# Patient Record
Sex: Male | Born: 1935 | Race: White | Hispanic: No | Smoking: Never smoker
Health system: Southern US, Community
[De-identification: ages and names within clinical notes are randomized; demographics above are authoritative.]

## PROBLEM LIST (undated history)

## (undated) DIAGNOSIS — D62 Acute posthemorrhagic anemia: Secondary | ICD-10-CM

## (undated) DIAGNOSIS — M6282 Rhabdomyolysis: Secondary | ICD-10-CM

## (undated) DIAGNOSIS — M1990 Unspecified osteoarthritis, unspecified site: Secondary | ICD-10-CM

## (undated) DIAGNOSIS — N390 Urinary tract infection, site not specified: Secondary | ICD-10-CM

## (undated) DIAGNOSIS — K922 Gastrointestinal hemorrhage, unspecified: Secondary | ICD-10-CM

## (undated) DIAGNOSIS — I214 Non-ST elevation (NSTEMI) myocardial infarction: Secondary | ICD-10-CM

## (undated) DIAGNOSIS — N400 Enlarged prostate without lower urinary tract symptoms: Secondary | ICD-10-CM

## (undated) DIAGNOSIS — F419 Anxiety disorder, unspecified: Secondary | ICD-10-CM

## (undated) DIAGNOSIS — I10 Essential (primary) hypertension: Secondary | ICD-10-CM

## (undated) DIAGNOSIS — E871 Hypo-osmolality and hyponatremia: Secondary | ICD-10-CM

## (undated) HISTORY — PX: OTHER SURGICAL HISTORY: SHX169

---

## 2011-08-07 DIAGNOSIS — H6980 Other specified disorders of Eustachian tube, unspecified ear: Secondary | ICD-10-CM | POA: Diagnosis not present

## 2012-03-17 DIAGNOSIS — I10 Essential (primary) hypertension: Secondary | ICD-10-CM | POA: Diagnosis not present

## 2012-03-17 DIAGNOSIS — G119 Hereditary ataxia, unspecified: Secondary | ICD-10-CM | POA: Diagnosis not present

## 2012-03-17 DIAGNOSIS — Z79899 Other long term (current) drug therapy: Secondary | ICD-10-CM | POA: Diagnosis not present

## 2012-03-17 DIAGNOSIS — R7309 Other abnormal glucose: Secondary | ICD-10-CM | POA: Diagnosis not present

## 2012-04-09 DIAGNOSIS — I10 Essential (primary) hypertension: Secondary | ICD-10-CM | POA: Diagnosis not present

## 2012-04-09 DIAGNOSIS — E875 Hyperkalemia: Secondary | ICD-10-CM | POA: Diagnosis not present

## 2012-10-18 DIAGNOSIS — I10 Essential (primary) hypertension: Secondary | ICD-10-CM | POA: Diagnosis not present

## 2012-10-18 DIAGNOSIS — E875 Hyperkalemia: Secondary | ICD-10-CM | POA: Diagnosis not present

## 2012-10-18 DIAGNOSIS — G119 Hereditary ataxia, unspecified: Secondary | ICD-10-CM | POA: Diagnosis not present

## 2012-12-10 DIAGNOSIS — I10 Essential (primary) hypertension: Secondary | ICD-10-CM | POA: Diagnosis not present

## 2012-12-10 DIAGNOSIS — R030 Elevated blood-pressure reading, without diagnosis of hypertension: Secondary | ICD-10-CM | POA: Diagnosis not present

## 2012-12-10 DIAGNOSIS — T7020XA Unspecified effects of high altitude, initial encounter: Secondary | ICD-10-CM | POA: Diagnosis not present

## 2012-12-10 DIAGNOSIS — R42 Dizziness and giddiness: Secondary | ICD-10-CM | POA: Diagnosis not present

## 2012-12-10 DIAGNOSIS — W9411XA Exposure to residence or prolonged visit at high altitude, initial encounter: Secondary | ICD-10-CM | POA: Diagnosis not present

## 2012-12-11 DIAGNOSIS — T7020XA Unspecified effects of high altitude, initial encounter: Secondary | ICD-10-CM | POA: Diagnosis not present

## 2012-12-11 DIAGNOSIS — I10 Essential (primary) hypertension: Secondary | ICD-10-CM | POA: Diagnosis not present

## 2012-12-11 DIAGNOSIS — W9411XA Exposure to residence or prolonged visit at high altitude, initial encounter: Secondary | ICD-10-CM | POA: Diagnosis not present

## 2012-12-11 DIAGNOSIS — R42 Dizziness and giddiness: Secondary | ICD-10-CM | POA: Diagnosis not present

## 2012-12-14 DIAGNOSIS — I10 Essential (primary) hypertension: Secondary | ICD-10-CM | POA: Diagnosis not present

## 2012-12-14 DIAGNOSIS — G119 Hereditary ataxia, unspecified: Secondary | ICD-10-CM | POA: Diagnosis not present

## 2012-12-19 DIAGNOSIS — R269 Unspecified abnormalities of gait and mobility: Secondary | ICD-10-CM | POA: Diagnosis not present

## 2012-12-19 DIAGNOSIS — Z7982 Long term (current) use of aspirin: Secondary | ICD-10-CM | POA: Diagnosis not present

## 2012-12-19 DIAGNOSIS — Z79899 Other long term (current) drug therapy: Secondary | ICD-10-CM | POA: Diagnosis not present

## 2012-12-19 DIAGNOSIS — E871 Hypo-osmolality and hyponatremia: Secondary | ICD-10-CM | POA: Diagnosis not present

## 2012-12-19 DIAGNOSIS — R5383 Other fatigue: Secondary | ICD-10-CM | POA: Diagnosis not present

## 2012-12-19 DIAGNOSIS — I10 Essential (primary) hypertension: Secondary | ICD-10-CM | POA: Diagnosis not present

## 2012-12-19 DIAGNOSIS — R5381 Other malaise: Secondary | ICD-10-CM | POA: Diagnosis not present

## 2012-12-28 DIAGNOSIS — E875 Hyperkalemia: Secondary | ICD-10-CM | POA: Diagnosis not present

## 2012-12-31 DIAGNOSIS — R0902 Hypoxemia: Secondary | ICD-10-CM | POA: Diagnosis not present

## 2013-01-07 DIAGNOSIS — Z0000 Encounter for general adult medical examination without abnormal findings: Secondary | ICD-10-CM | POA: Diagnosis not present

## 2013-01-07 DIAGNOSIS — E871 Hypo-osmolality and hyponatremia: Secondary | ICD-10-CM | POA: Diagnosis not present

## 2013-01-07 DIAGNOSIS — I479 Paroxysmal tachycardia, unspecified: Secondary | ICD-10-CM | POA: Diagnosis not present

## 2013-01-10 DIAGNOSIS — I479 Paroxysmal tachycardia, unspecified: Secondary | ICD-10-CM | POA: Diagnosis not present

## 2013-02-07 DIAGNOSIS — R7989 Other specified abnormal findings of blood chemistry: Secondary | ICD-10-CM | POA: Diagnosis not present

## 2013-03-22 DIAGNOSIS — R7989 Other specified abnormal findings of blood chemistry: Secondary | ICD-10-CM | POA: Diagnosis not present

## 2013-04-13 DIAGNOSIS — I10 Essential (primary) hypertension: Secondary | ICD-10-CM | POA: Diagnosis not present

## 2013-07-12 DIAGNOSIS — J9819 Other pulmonary collapse: Secondary | ICD-10-CM | POA: Diagnosis not present

## 2013-07-12 DIAGNOSIS — R42 Dizziness and giddiness: Secondary | ICD-10-CM | POA: Diagnosis not present

## 2013-07-12 DIAGNOSIS — R5381 Other malaise: Secondary | ICD-10-CM | POA: Diagnosis not present

## 2013-07-14 DIAGNOSIS — Z006 Encounter for examination for normal comparison and control in clinical research program: Secondary | ICD-10-CM | POA: Diagnosis not present

## 2013-07-14 DIAGNOSIS — E871 Hypo-osmolality and hyponatremia: Secondary | ICD-10-CM | POA: Diagnosis not present

## 2013-07-14 DIAGNOSIS — I10 Essential (primary) hypertension: Secondary | ICD-10-CM | POA: Diagnosis not present

## 2013-07-14 DIAGNOSIS — R7989 Other specified abnormal findings of blood chemistry: Secondary | ICD-10-CM | POA: Diagnosis not present

## 2013-12-16 DIAGNOSIS — J309 Allergic rhinitis, unspecified: Secondary | ICD-10-CM | POA: Diagnosis not present

## 2013-12-16 DIAGNOSIS — E860 Dehydration: Secondary | ICD-10-CM | POA: Diagnosis not present

## 2013-12-16 DIAGNOSIS — Z23 Encounter for immunization: Secondary | ICD-10-CM | POA: Diagnosis not present

## 2013-12-16 DIAGNOSIS — Z79899 Other long term (current) drug therapy: Secondary | ICD-10-CM | POA: Diagnosis not present

## 2013-12-16 DIAGNOSIS — R42 Dizziness and giddiness: Secondary | ICD-10-CM | POA: Diagnosis not present

## 2014-02-08 DIAGNOSIS — R5383 Other fatigue: Secondary | ICD-10-CM | POA: Diagnosis not present

## 2014-02-08 DIAGNOSIS — Z79899 Other long term (current) drug therapy: Secondary | ICD-10-CM | POA: Diagnosis not present

## 2014-02-08 DIAGNOSIS — R5381 Other malaise: Secondary | ICD-10-CM | POA: Diagnosis not present

## 2014-02-08 DIAGNOSIS — I10 Essential (primary) hypertension: Secondary | ICD-10-CM | POA: Diagnosis not present

## 2014-02-08 DIAGNOSIS — R51 Headache: Secondary | ICD-10-CM | POA: Diagnosis not present

## 2014-02-08 DIAGNOSIS — E871 Hypo-osmolality and hyponatremia: Secondary | ICD-10-CM | POA: Diagnosis not present

## 2014-02-08 DIAGNOSIS — G319 Degenerative disease of nervous system, unspecified: Secondary | ICD-10-CM | POA: Diagnosis not present

## 2014-02-09 DIAGNOSIS — R5383 Other fatigue: Secondary | ICD-10-CM | POA: Diagnosis not present

## 2014-02-09 DIAGNOSIS — R5381 Other malaise: Secondary | ICD-10-CM | POA: Diagnosis not present

## 2014-02-13 DIAGNOSIS — R5383 Other fatigue: Secondary | ICD-10-CM | POA: Diagnosis not present

## 2014-02-13 DIAGNOSIS — E871 Hypo-osmolality and hyponatremia: Secondary | ICD-10-CM | POA: Diagnosis not present

## 2014-02-13 DIAGNOSIS — R5381 Other malaise: Secondary | ICD-10-CM | POA: Diagnosis not present

## 2014-03-08 DIAGNOSIS — R5381 Other malaise: Secondary | ICD-10-CM | POA: Diagnosis not present

## 2014-03-08 DIAGNOSIS — R5383 Other fatigue: Secondary | ICD-10-CM | POA: Diagnosis not present

## 2014-03-08 DIAGNOSIS — Z6827 Body mass index (BMI) 27.0-27.9, adult: Secondary | ICD-10-CM | POA: Diagnosis not present

## 2014-03-08 DIAGNOSIS — R351 Nocturia: Secondary | ICD-10-CM | POA: Diagnosis not present

## 2014-03-08 DIAGNOSIS — I10 Essential (primary) hypertension: Secondary | ICD-10-CM | POA: Diagnosis not present

## 2014-03-08 DIAGNOSIS — N390 Urinary tract infection, site not specified: Secondary | ICD-10-CM | POA: Diagnosis not present

## 2014-03-23 DIAGNOSIS — Z139 Encounter for screening, unspecified: Secondary | ICD-10-CM | POA: Diagnosis not present

## 2014-03-23 DIAGNOSIS — Z0000 Encounter for general adult medical examination without abnormal findings: Secondary | ICD-10-CM | POA: Diagnosis not present

## 2014-03-23 DIAGNOSIS — R5381 Other malaise: Secondary | ICD-10-CM | POA: Diagnosis not present

## 2014-03-23 DIAGNOSIS — R358 Other polyuria: Secondary | ICD-10-CM | POA: Diagnosis not present

## 2014-03-23 DIAGNOSIS — Z1331 Encounter for screening for depression: Secondary | ICD-10-CM | POA: Diagnosis not present

## 2014-03-23 DIAGNOSIS — R350 Frequency of micturition: Secondary | ICD-10-CM | POA: Diagnosis not present

## 2014-03-23 DIAGNOSIS — I10 Essential (primary) hypertension: Secondary | ICD-10-CM | POA: Diagnosis not present

## 2014-03-23 DIAGNOSIS — R3589 Other polyuria: Secondary | ICD-10-CM | POA: Diagnosis not present

## 2014-03-23 DIAGNOSIS — Z1389 Encounter for screening for other disorder: Secondary | ICD-10-CM | POA: Diagnosis not present

## 2014-04-03 DIAGNOSIS — E875 Hyperkalemia: Secondary | ICD-10-CM | POA: Diagnosis not present

## 2014-04-03 DIAGNOSIS — D72829 Elevated white blood cell count, unspecified: Secondary | ICD-10-CM | POA: Diagnosis not present

## 2014-04-03 DIAGNOSIS — Z6827 Body mass index (BMI) 27.0-27.9, adult: Secondary | ICD-10-CM | POA: Diagnosis not present

## 2014-04-13 DIAGNOSIS — E875 Hyperkalemia: Secondary | ICD-10-CM | POA: Diagnosis not present

## 2014-04-13 DIAGNOSIS — R000 Tachycardia, unspecified: Secondary | ICD-10-CM | POA: Diagnosis not present

## 2014-04-13 DIAGNOSIS — D72829 Elevated white blood cell count, unspecified: Secondary | ICD-10-CM | POA: Diagnosis not present

## 2014-04-13 DIAGNOSIS — G4700 Insomnia, unspecified: Secondary | ICD-10-CM | POA: Diagnosis not present

## 2014-04-28 DIAGNOSIS — R5382 Chronic fatigue, unspecified: Secondary | ICD-10-CM | POA: Diagnosis not present

## 2014-04-28 DIAGNOSIS — G9332 Myalgic encephalomyelitis/chronic fatigue syndrome: Secondary | ICD-10-CM | POA: Diagnosis not present

## 2014-04-28 DIAGNOSIS — R42 Dizziness and giddiness: Secondary | ICD-10-CM | POA: Diagnosis not present

## 2014-04-28 DIAGNOSIS — R002 Palpitations: Secondary | ICD-10-CM | POA: Diagnosis not present

## 2014-04-28 DIAGNOSIS — I10 Essential (primary) hypertension: Secondary | ICD-10-CM | POA: Diagnosis not present

## 2014-05-08 DIAGNOSIS — Z6827 Body mass index (BMI) 27.0-27.9, adult: Secondary | ICD-10-CM | POA: Diagnosis not present

## 2014-05-08 DIAGNOSIS — D72829 Elevated white blood cell count, unspecified: Secondary | ICD-10-CM | POA: Diagnosis not present

## 2014-05-08 DIAGNOSIS — E875 Hyperkalemia: Secondary | ICD-10-CM | POA: Diagnosis not present

## 2014-05-08 DIAGNOSIS — G4700 Insomnia, unspecified: Secondary | ICD-10-CM | POA: Diagnosis not present

## 2014-05-15 DIAGNOSIS — R002 Palpitations: Secondary | ICD-10-CM | POA: Diagnosis not present

## 2014-05-17 DIAGNOSIS — R002 Palpitations: Secondary | ICD-10-CM | POA: Diagnosis not present

## 2014-05-25 DIAGNOSIS — R2681 Unsteadiness on feet: Secondary | ICD-10-CM | POA: Diagnosis not present

## 2014-05-25 DIAGNOSIS — I10 Essential (primary) hypertension: Secondary | ICD-10-CM | POA: Diagnosis not present

## 2014-05-25 DIAGNOSIS — R002 Palpitations: Secondary | ICD-10-CM | POA: Diagnosis not present

## 2014-06-21 DIAGNOSIS — S329XXD Fracture of unspecified parts of lumbosacral spine and pelvis, subsequent encounter for fracture with routine healing: Secondary | ICD-10-CM | POA: Diagnosis not present

## 2014-06-21 DIAGNOSIS — M25552 Pain in left hip: Secondary | ICD-10-CM | POA: Diagnosis not present

## 2014-06-21 DIAGNOSIS — M1612 Unilateral primary osteoarthritis, left hip: Secondary | ICD-10-CM | POA: Diagnosis not present

## 2014-06-21 DIAGNOSIS — Z6827 Body mass index (BMI) 27.0-27.9, adult: Secondary | ICD-10-CM | POA: Diagnosis not present

## 2014-06-26 DIAGNOSIS — R2689 Other abnormalities of gait and mobility: Secondary | ICD-10-CM | POA: Diagnosis not present

## 2014-06-26 DIAGNOSIS — R262 Difficulty in walking, not elsewhere classified: Secondary | ICD-10-CM | POA: Diagnosis not present

## 2014-06-26 DIAGNOSIS — R2681 Unsteadiness on feet: Secondary | ICD-10-CM | POA: Diagnosis not present

## 2014-06-26 DIAGNOSIS — M6281 Muscle weakness (generalized): Secondary | ICD-10-CM | POA: Diagnosis not present

## 2014-07-04 DIAGNOSIS — R262 Difficulty in walking, not elsewhere classified: Secondary | ICD-10-CM | POA: Diagnosis not present

## 2014-07-04 DIAGNOSIS — R2681 Unsteadiness on feet: Secondary | ICD-10-CM | POA: Diagnosis not present

## 2014-07-04 DIAGNOSIS — R2689 Other abnormalities of gait and mobility: Secondary | ICD-10-CM | POA: Diagnosis not present

## 2014-07-04 DIAGNOSIS — M6281 Muscle weakness (generalized): Secondary | ICD-10-CM | POA: Diagnosis not present

## 2014-07-06 DIAGNOSIS — R2689 Other abnormalities of gait and mobility: Secondary | ICD-10-CM | POA: Diagnosis not present

## 2014-07-06 DIAGNOSIS — R2681 Unsteadiness on feet: Secondary | ICD-10-CM | POA: Diagnosis not present

## 2014-07-06 DIAGNOSIS — R262 Difficulty in walking, not elsewhere classified: Secondary | ICD-10-CM | POA: Diagnosis not present

## 2014-07-06 DIAGNOSIS — M6281 Muscle weakness (generalized): Secondary | ICD-10-CM | POA: Diagnosis not present

## 2014-07-13 DIAGNOSIS — R2689 Other abnormalities of gait and mobility: Secondary | ICD-10-CM | POA: Diagnosis not present

## 2014-07-13 DIAGNOSIS — M6281 Muscle weakness (generalized): Secondary | ICD-10-CM | POA: Diagnosis not present

## 2014-07-13 DIAGNOSIS — R2681 Unsteadiness on feet: Secondary | ICD-10-CM | POA: Diagnosis not present

## 2014-07-13 DIAGNOSIS — R262 Difficulty in walking, not elsewhere classified: Secondary | ICD-10-CM | POA: Diagnosis not present

## 2014-07-14 DIAGNOSIS — R262 Difficulty in walking, not elsewhere classified: Secondary | ICD-10-CM | POA: Diagnosis not present

## 2014-07-14 DIAGNOSIS — R2689 Other abnormalities of gait and mobility: Secondary | ICD-10-CM | POA: Diagnosis not present

## 2014-07-14 DIAGNOSIS — R2681 Unsteadiness on feet: Secondary | ICD-10-CM | POA: Diagnosis not present

## 2014-07-14 DIAGNOSIS — M6281 Muscle weakness (generalized): Secondary | ICD-10-CM | POA: Diagnosis not present

## 2014-07-17 DIAGNOSIS — R2681 Unsteadiness on feet: Secondary | ICD-10-CM | POA: Diagnosis not present

## 2014-07-17 DIAGNOSIS — M6281 Muscle weakness (generalized): Secondary | ICD-10-CM | POA: Diagnosis not present

## 2014-07-17 DIAGNOSIS — R262 Difficulty in walking, not elsewhere classified: Secondary | ICD-10-CM | POA: Diagnosis not present

## 2014-07-17 DIAGNOSIS — R2689 Other abnormalities of gait and mobility: Secondary | ICD-10-CM | POA: Diagnosis not present

## 2014-07-20 DIAGNOSIS — M6281 Muscle weakness (generalized): Secondary | ICD-10-CM | POA: Diagnosis not present

## 2014-07-20 DIAGNOSIS — R2689 Other abnormalities of gait and mobility: Secondary | ICD-10-CM | POA: Diagnosis not present

## 2014-07-20 DIAGNOSIS — R2681 Unsteadiness on feet: Secondary | ICD-10-CM | POA: Diagnosis not present

## 2014-07-20 DIAGNOSIS — R262 Difficulty in walking, not elsewhere classified: Secondary | ICD-10-CM | POA: Diagnosis not present

## 2014-07-24 DIAGNOSIS — M6281 Muscle weakness (generalized): Secondary | ICD-10-CM | POA: Diagnosis not present

## 2014-07-24 DIAGNOSIS — R262 Difficulty in walking, not elsewhere classified: Secondary | ICD-10-CM | POA: Diagnosis not present

## 2014-07-24 DIAGNOSIS — R2681 Unsteadiness on feet: Secondary | ICD-10-CM | POA: Diagnosis not present

## 2014-07-24 DIAGNOSIS — R2689 Other abnormalities of gait and mobility: Secondary | ICD-10-CM | POA: Diagnosis not present

## 2014-07-31 DIAGNOSIS — M6281 Muscle weakness (generalized): Secondary | ICD-10-CM | POA: Diagnosis not present

## 2014-07-31 DIAGNOSIS — R2681 Unsteadiness on feet: Secondary | ICD-10-CM | POA: Diagnosis not present

## 2014-07-31 DIAGNOSIS — R262 Difficulty in walking, not elsewhere classified: Secondary | ICD-10-CM | POA: Diagnosis not present

## 2014-07-31 DIAGNOSIS — R2689 Other abnormalities of gait and mobility: Secondary | ICD-10-CM | POA: Diagnosis not present

## 2014-08-02 DIAGNOSIS — R262 Difficulty in walking, not elsewhere classified: Secondary | ICD-10-CM | POA: Diagnosis not present

## 2014-08-02 DIAGNOSIS — R2681 Unsteadiness on feet: Secondary | ICD-10-CM | POA: Diagnosis not present

## 2014-08-02 DIAGNOSIS — R2689 Other abnormalities of gait and mobility: Secondary | ICD-10-CM | POA: Diagnosis not present

## 2014-08-02 DIAGNOSIS — M6281 Muscle weakness (generalized): Secondary | ICD-10-CM | POA: Diagnosis not present

## 2014-08-07 DIAGNOSIS — R2689 Other abnormalities of gait and mobility: Secondary | ICD-10-CM | POA: Diagnosis not present

## 2014-08-07 DIAGNOSIS — R2681 Unsteadiness on feet: Secondary | ICD-10-CM | POA: Diagnosis not present

## 2014-08-07 DIAGNOSIS — R262 Difficulty in walking, not elsewhere classified: Secondary | ICD-10-CM | POA: Diagnosis not present

## 2014-08-07 DIAGNOSIS — M6281 Muscle weakness (generalized): Secondary | ICD-10-CM | POA: Diagnosis not present

## 2014-08-10 DIAGNOSIS — R2689 Other abnormalities of gait and mobility: Secondary | ICD-10-CM | POA: Diagnosis not present

## 2014-08-10 DIAGNOSIS — R262 Difficulty in walking, not elsewhere classified: Secondary | ICD-10-CM | POA: Diagnosis not present

## 2014-08-10 DIAGNOSIS — R2681 Unsteadiness on feet: Secondary | ICD-10-CM | POA: Diagnosis not present

## 2014-08-10 DIAGNOSIS — M6281 Muscle weakness (generalized): Secondary | ICD-10-CM | POA: Diagnosis not present

## 2014-08-14 DIAGNOSIS — R2689 Other abnormalities of gait and mobility: Secondary | ICD-10-CM | POA: Diagnosis not present

## 2014-08-14 DIAGNOSIS — R2681 Unsteadiness on feet: Secondary | ICD-10-CM | POA: Diagnosis not present

## 2014-08-14 DIAGNOSIS — R262 Difficulty in walking, not elsewhere classified: Secondary | ICD-10-CM | POA: Diagnosis not present

## 2014-08-14 DIAGNOSIS — M6281 Muscle weakness (generalized): Secondary | ICD-10-CM | POA: Diagnosis not present

## 2014-08-15 DIAGNOSIS — Z13220 Encounter for screening for lipoid disorders: Secondary | ICD-10-CM | POA: Diagnosis not present

## 2014-08-15 DIAGNOSIS — I10 Essential (primary) hypertension: Secondary | ICD-10-CM | POA: Diagnosis not present

## 2014-08-16 DIAGNOSIS — R2681 Unsteadiness on feet: Secondary | ICD-10-CM | POA: Diagnosis not present

## 2014-08-16 DIAGNOSIS — R2689 Other abnormalities of gait and mobility: Secondary | ICD-10-CM | POA: Diagnosis not present

## 2014-08-16 DIAGNOSIS — M6281 Muscle weakness (generalized): Secondary | ICD-10-CM | POA: Diagnosis not present

## 2014-08-16 DIAGNOSIS — R262 Difficulty in walking, not elsewhere classified: Secondary | ICD-10-CM | POA: Diagnosis not present

## 2014-08-21 DIAGNOSIS — R2689 Other abnormalities of gait and mobility: Secondary | ICD-10-CM | POA: Diagnosis not present

## 2014-08-21 DIAGNOSIS — R2681 Unsteadiness on feet: Secondary | ICD-10-CM | POA: Diagnosis not present

## 2014-08-21 DIAGNOSIS — R262 Difficulty in walking, not elsewhere classified: Secondary | ICD-10-CM | POA: Diagnosis not present

## 2014-08-21 DIAGNOSIS — M6281 Muscle weakness (generalized): Secondary | ICD-10-CM | POA: Diagnosis not present

## 2014-08-23 DIAGNOSIS — R262 Difficulty in walking, not elsewhere classified: Secondary | ICD-10-CM | POA: Diagnosis not present

## 2014-08-23 DIAGNOSIS — R2689 Other abnormalities of gait and mobility: Secondary | ICD-10-CM | POA: Diagnosis not present

## 2014-08-23 DIAGNOSIS — R2681 Unsteadiness on feet: Secondary | ICD-10-CM | POA: Diagnosis not present

## 2014-08-23 DIAGNOSIS — M6281 Muscle weakness (generalized): Secondary | ICD-10-CM | POA: Diagnosis not present

## 2014-08-24 DIAGNOSIS — I10 Essential (primary) hypertension: Secondary | ICD-10-CM | POA: Diagnosis not present

## 2014-08-24 DIAGNOSIS — G4700 Insomnia, unspecified: Secondary | ICD-10-CM | POA: Diagnosis not present

## 2014-08-24 DIAGNOSIS — R351 Nocturia: Secondary | ICD-10-CM | POA: Diagnosis not present

## 2014-08-24 DIAGNOSIS — N401 Enlarged prostate with lower urinary tract symptoms: Secondary | ICD-10-CM | POA: Diagnosis not present

## 2014-08-28 DIAGNOSIS — R2681 Unsteadiness on feet: Secondary | ICD-10-CM | POA: Diagnosis not present

## 2014-08-28 DIAGNOSIS — M6281 Muscle weakness (generalized): Secondary | ICD-10-CM | POA: Diagnosis not present

## 2014-08-28 DIAGNOSIS — R2689 Other abnormalities of gait and mobility: Secondary | ICD-10-CM | POA: Diagnosis not present

## 2014-08-28 DIAGNOSIS — R262 Difficulty in walking, not elsewhere classified: Secondary | ICD-10-CM | POA: Diagnosis not present

## 2014-08-30 DIAGNOSIS — R2689 Other abnormalities of gait and mobility: Secondary | ICD-10-CM | POA: Diagnosis not present

## 2014-08-30 DIAGNOSIS — M6281 Muscle weakness (generalized): Secondary | ICD-10-CM | POA: Diagnosis not present

## 2014-08-30 DIAGNOSIS — R262 Difficulty in walking, not elsewhere classified: Secondary | ICD-10-CM | POA: Diagnosis not present

## 2014-08-30 DIAGNOSIS — R2681 Unsteadiness on feet: Secondary | ICD-10-CM | POA: Diagnosis not present

## 2014-09-04 DIAGNOSIS — R2681 Unsteadiness on feet: Secondary | ICD-10-CM | POA: Diagnosis not present

## 2014-09-04 DIAGNOSIS — R2689 Other abnormalities of gait and mobility: Secondary | ICD-10-CM | POA: Diagnosis not present

## 2014-09-04 DIAGNOSIS — R262 Difficulty in walking, not elsewhere classified: Secondary | ICD-10-CM | POA: Diagnosis not present

## 2014-09-04 DIAGNOSIS — M6281 Muscle weakness (generalized): Secondary | ICD-10-CM | POA: Diagnosis not present

## 2014-09-08 DIAGNOSIS — M6281 Muscle weakness (generalized): Secondary | ICD-10-CM | POA: Diagnosis not present

## 2014-09-08 DIAGNOSIS — R262 Difficulty in walking, not elsewhere classified: Secondary | ICD-10-CM | POA: Diagnosis not present

## 2014-09-08 DIAGNOSIS — R2689 Other abnormalities of gait and mobility: Secondary | ICD-10-CM | POA: Diagnosis not present

## 2014-09-08 DIAGNOSIS — R2681 Unsteadiness on feet: Secondary | ICD-10-CM | POA: Diagnosis not present

## 2014-09-11 DIAGNOSIS — R2681 Unsteadiness on feet: Secondary | ICD-10-CM | POA: Diagnosis not present

## 2014-09-11 DIAGNOSIS — R2689 Other abnormalities of gait and mobility: Secondary | ICD-10-CM | POA: Diagnosis not present

## 2014-09-11 DIAGNOSIS — R262 Difficulty in walking, not elsewhere classified: Secondary | ICD-10-CM | POA: Diagnosis not present

## 2014-09-11 DIAGNOSIS — M6281 Muscle weakness (generalized): Secondary | ICD-10-CM | POA: Diagnosis not present

## 2014-09-13 DIAGNOSIS — R262 Difficulty in walking, not elsewhere classified: Secondary | ICD-10-CM | POA: Diagnosis not present

## 2014-09-13 DIAGNOSIS — R2689 Other abnormalities of gait and mobility: Secondary | ICD-10-CM | POA: Diagnosis not present

## 2014-09-13 DIAGNOSIS — M6281 Muscle weakness (generalized): Secondary | ICD-10-CM | POA: Diagnosis not present

## 2014-09-13 DIAGNOSIS — R2681 Unsteadiness on feet: Secondary | ICD-10-CM | POA: Diagnosis not present

## 2014-09-18 DIAGNOSIS — R2689 Other abnormalities of gait and mobility: Secondary | ICD-10-CM | POA: Diagnosis not present

## 2014-09-18 DIAGNOSIS — R2681 Unsteadiness on feet: Secondary | ICD-10-CM | POA: Diagnosis not present

## 2014-09-18 DIAGNOSIS — M6281 Muscle weakness (generalized): Secondary | ICD-10-CM | POA: Diagnosis not present

## 2014-09-18 DIAGNOSIS — R262 Difficulty in walking, not elsewhere classified: Secondary | ICD-10-CM | POA: Diagnosis not present

## 2014-09-20 DIAGNOSIS — R262 Difficulty in walking, not elsewhere classified: Secondary | ICD-10-CM | POA: Diagnosis not present

## 2014-09-20 DIAGNOSIS — M6281 Muscle weakness (generalized): Secondary | ICD-10-CM | POA: Diagnosis not present

## 2014-09-20 DIAGNOSIS — R2681 Unsteadiness on feet: Secondary | ICD-10-CM | POA: Diagnosis not present

## 2014-09-20 DIAGNOSIS — R2689 Other abnormalities of gait and mobility: Secondary | ICD-10-CM | POA: Diagnosis not present

## 2014-09-25 DIAGNOSIS — M6281 Muscle weakness (generalized): Secondary | ICD-10-CM | POA: Diagnosis not present

## 2014-09-25 DIAGNOSIS — R2681 Unsteadiness on feet: Secondary | ICD-10-CM | POA: Diagnosis not present

## 2014-09-25 DIAGNOSIS — R262 Difficulty in walking, not elsewhere classified: Secondary | ICD-10-CM | POA: Diagnosis not present

## 2014-09-25 DIAGNOSIS — R2689 Other abnormalities of gait and mobility: Secondary | ICD-10-CM | POA: Diagnosis not present

## 2014-09-26 DIAGNOSIS — M6281 Muscle weakness (generalized): Secondary | ICD-10-CM | POA: Diagnosis not present

## 2014-09-26 DIAGNOSIS — R262 Difficulty in walking, not elsewhere classified: Secondary | ICD-10-CM | POA: Diagnosis not present

## 2014-09-26 DIAGNOSIS — R2689 Other abnormalities of gait and mobility: Secondary | ICD-10-CM | POA: Diagnosis not present

## 2014-09-26 DIAGNOSIS — R2681 Unsteadiness on feet: Secondary | ICD-10-CM | POA: Diagnosis not present

## 2014-10-02 DIAGNOSIS — R2689 Other abnormalities of gait and mobility: Secondary | ICD-10-CM | POA: Diagnosis not present

## 2014-10-02 DIAGNOSIS — R2681 Unsteadiness on feet: Secondary | ICD-10-CM | POA: Diagnosis not present

## 2014-10-02 DIAGNOSIS — R262 Difficulty in walking, not elsewhere classified: Secondary | ICD-10-CM | POA: Diagnosis not present

## 2014-10-02 DIAGNOSIS — M6281 Muscle weakness (generalized): Secondary | ICD-10-CM | POA: Diagnosis not present

## 2014-10-04 DIAGNOSIS — R2689 Other abnormalities of gait and mobility: Secondary | ICD-10-CM | POA: Diagnosis not present

## 2014-10-04 DIAGNOSIS — M6281 Muscle weakness (generalized): Secondary | ICD-10-CM | POA: Diagnosis not present

## 2014-10-04 DIAGNOSIS — R262 Difficulty in walking, not elsewhere classified: Secondary | ICD-10-CM | POA: Diagnosis not present

## 2014-10-04 DIAGNOSIS — R2681 Unsteadiness on feet: Secondary | ICD-10-CM | POA: Diagnosis not present

## 2014-10-13 DIAGNOSIS — R2689 Other abnormalities of gait and mobility: Secondary | ICD-10-CM | POA: Diagnosis not present

## 2014-10-13 DIAGNOSIS — R2681 Unsteadiness on feet: Secondary | ICD-10-CM | POA: Diagnosis not present

## 2014-10-13 DIAGNOSIS — R262 Difficulty in walking, not elsewhere classified: Secondary | ICD-10-CM | POA: Diagnosis not present

## 2014-10-13 DIAGNOSIS — M6281 Muscle weakness (generalized): Secondary | ICD-10-CM | POA: Diagnosis not present

## 2014-10-17 DIAGNOSIS — M6281 Muscle weakness (generalized): Secondary | ICD-10-CM | POA: Diagnosis not present

## 2014-10-17 DIAGNOSIS — R2681 Unsteadiness on feet: Secondary | ICD-10-CM | POA: Diagnosis not present

## 2014-10-17 DIAGNOSIS — R262 Difficulty in walking, not elsewhere classified: Secondary | ICD-10-CM | POA: Diagnosis not present

## 2014-10-17 DIAGNOSIS — R2689 Other abnormalities of gait and mobility: Secondary | ICD-10-CM | POA: Diagnosis not present

## 2014-10-18 DIAGNOSIS — R262 Difficulty in walking, not elsewhere classified: Secondary | ICD-10-CM | POA: Diagnosis not present

## 2014-10-18 DIAGNOSIS — R2681 Unsteadiness on feet: Secondary | ICD-10-CM | POA: Diagnosis not present

## 2014-10-18 DIAGNOSIS — M6281 Muscle weakness (generalized): Secondary | ICD-10-CM | POA: Diagnosis not present

## 2014-10-18 DIAGNOSIS — R2689 Other abnormalities of gait and mobility: Secondary | ICD-10-CM | POA: Diagnosis not present

## 2014-10-22 DIAGNOSIS — R0602 Shortness of breath: Secondary | ICD-10-CM | POA: Diagnosis not present

## 2014-10-22 DIAGNOSIS — R42 Dizziness and giddiness: Secondary | ICD-10-CM | POA: Diagnosis not present

## 2014-10-22 DIAGNOSIS — I10 Essential (primary) hypertension: Secondary | ICD-10-CM | POA: Diagnosis not present

## 2014-10-22 DIAGNOSIS — R300 Dysuria: Secondary | ICD-10-CM | POA: Diagnosis not present

## 2014-10-22 DIAGNOSIS — Z7982 Long term (current) use of aspirin: Secondary | ICD-10-CM | POA: Diagnosis not present

## 2014-10-25 DIAGNOSIS — R262 Difficulty in walking, not elsewhere classified: Secondary | ICD-10-CM | POA: Diagnosis not present

## 2014-10-25 DIAGNOSIS — R2689 Other abnormalities of gait and mobility: Secondary | ICD-10-CM | POA: Diagnosis not present

## 2014-10-25 DIAGNOSIS — R2681 Unsteadiness on feet: Secondary | ICD-10-CM | POA: Diagnosis not present

## 2014-10-25 DIAGNOSIS — M6281 Muscle weakness (generalized): Secondary | ICD-10-CM | POA: Diagnosis not present

## 2014-10-27 DIAGNOSIS — Z6827 Body mass index (BMI) 27.0-27.9, adult: Secondary | ICD-10-CM | POA: Diagnosis not present

## 2014-10-27 DIAGNOSIS — R270 Ataxia, unspecified: Secondary | ICD-10-CM | POA: Diagnosis not present

## 2014-10-30 DIAGNOSIS — R2681 Unsteadiness on feet: Secondary | ICD-10-CM | POA: Diagnosis not present

## 2014-10-30 DIAGNOSIS — R262 Difficulty in walking, not elsewhere classified: Secondary | ICD-10-CM | POA: Diagnosis not present

## 2014-10-30 DIAGNOSIS — M6281 Muscle weakness (generalized): Secondary | ICD-10-CM | POA: Diagnosis not present

## 2014-10-30 DIAGNOSIS — R2689 Other abnormalities of gait and mobility: Secondary | ICD-10-CM | POA: Diagnosis not present

## 2014-11-01 DIAGNOSIS — R2681 Unsteadiness on feet: Secondary | ICD-10-CM | POA: Diagnosis not present

## 2014-11-01 DIAGNOSIS — R262 Difficulty in walking, not elsewhere classified: Secondary | ICD-10-CM | POA: Diagnosis not present

## 2014-11-01 DIAGNOSIS — R2689 Other abnormalities of gait and mobility: Secondary | ICD-10-CM | POA: Diagnosis not present

## 2014-11-01 DIAGNOSIS — M6281 Muscle weakness (generalized): Secondary | ICD-10-CM | POA: Diagnosis not present

## 2014-11-06 DIAGNOSIS — M6281 Muscle weakness (generalized): Secondary | ICD-10-CM | POA: Diagnosis not present

## 2014-11-06 DIAGNOSIS — R2689 Other abnormalities of gait and mobility: Secondary | ICD-10-CM | POA: Diagnosis not present

## 2014-11-06 DIAGNOSIS — R2681 Unsteadiness on feet: Secondary | ICD-10-CM | POA: Diagnosis not present

## 2014-11-06 DIAGNOSIS — R262 Difficulty in walking, not elsewhere classified: Secondary | ICD-10-CM | POA: Diagnosis not present

## 2014-11-08 DIAGNOSIS — R262 Difficulty in walking, not elsewhere classified: Secondary | ICD-10-CM | POA: Diagnosis not present

## 2014-11-08 DIAGNOSIS — M6281 Muscle weakness (generalized): Secondary | ICD-10-CM | POA: Diagnosis not present

## 2014-11-08 DIAGNOSIS — R2689 Other abnormalities of gait and mobility: Secondary | ICD-10-CM | POA: Diagnosis not present

## 2014-11-08 DIAGNOSIS — R2681 Unsteadiness on feet: Secondary | ICD-10-CM | POA: Diagnosis not present

## 2014-11-13 DIAGNOSIS — R262 Difficulty in walking, not elsewhere classified: Secondary | ICD-10-CM | POA: Diagnosis not present

## 2014-11-13 DIAGNOSIS — R2681 Unsteadiness on feet: Secondary | ICD-10-CM | POA: Diagnosis not present

## 2014-11-13 DIAGNOSIS — R2689 Other abnormalities of gait and mobility: Secondary | ICD-10-CM | POA: Diagnosis not present

## 2014-11-13 DIAGNOSIS — M6281 Muscle weakness (generalized): Secondary | ICD-10-CM | POA: Diagnosis not present

## 2014-11-15 DIAGNOSIS — R262 Difficulty in walking, not elsewhere classified: Secondary | ICD-10-CM | POA: Diagnosis not present

## 2014-11-15 DIAGNOSIS — R2689 Other abnormalities of gait and mobility: Secondary | ICD-10-CM | POA: Diagnosis not present

## 2014-11-15 DIAGNOSIS — R2681 Unsteadiness on feet: Secondary | ICD-10-CM | POA: Diagnosis not present

## 2014-11-15 DIAGNOSIS — M6281 Muscle weakness (generalized): Secondary | ICD-10-CM | POA: Diagnosis not present

## 2014-11-20 DIAGNOSIS — R262 Difficulty in walking, not elsewhere classified: Secondary | ICD-10-CM | POA: Diagnosis not present

## 2014-11-20 DIAGNOSIS — R2689 Other abnormalities of gait and mobility: Secondary | ICD-10-CM | POA: Diagnosis not present

## 2014-11-20 DIAGNOSIS — R2681 Unsteadiness on feet: Secondary | ICD-10-CM | POA: Diagnosis not present

## 2014-11-20 DIAGNOSIS — M6281 Muscle weakness (generalized): Secondary | ICD-10-CM | POA: Diagnosis not present

## 2014-11-22 DIAGNOSIS — R262 Difficulty in walking, not elsewhere classified: Secondary | ICD-10-CM | POA: Diagnosis not present

## 2014-11-22 DIAGNOSIS — M6281 Muscle weakness (generalized): Secondary | ICD-10-CM | POA: Diagnosis not present

## 2014-11-22 DIAGNOSIS — R2689 Other abnormalities of gait and mobility: Secondary | ICD-10-CM | POA: Diagnosis not present

## 2014-11-22 DIAGNOSIS — R2681 Unsteadiness on feet: Secondary | ICD-10-CM | POA: Diagnosis not present

## 2014-11-27 DIAGNOSIS — R2681 Unsteadiness on feet: Secondary | ICD-10-CM | POA: Diagnosis not present

## 2014-11-27 DIAGNOSIS — R262 Difficulty in walking, not elsewhere classified: Secondary | ICD-10-CM | POA: Diagnosis not present

## 2014-11-27 DIAGNOSIS — R2689 Other abnormalities of gait and mobility: Secondary | ICD-10-CM | POA: Diagnosis not present

## 2014-11-27 DIAGNOSIS — M6281 Muscle weakness (generalized): Secondary | ICD-10-CM | POA: Diagnosis not present

## 2014-11-29 DIAGNOSIS — R262 Difficulty in walking, not elsewhere classified: Secondary | ICD-10-CM | POA: Diagnosis not present

## 2014-11-29 DIAGNOSIS — R2689 Other abnormalities of gait and mobility: Secondary | ICD-10-CM | POA: Diagnosis not present

## 2014-11-29 DIAGNOSIS — M6281 Muscle weakness (generalized): Secondary | ICD-10-CM | POA: Diagnosis not present

## 2014-11-29 DIAGNOSIS — R2681 Unsteadiness on feet: Secondary | ICD-10-CM | POA: Diagnosis not present

## 2014-12-04 DIAGNOSIS — R2681 Unsteadiness on feet: Secondary | ICD-10-CM | POA: Diagnosis not present

## 2014-12-04 DIAGNOSIS — M6281 Muscle weakness (generalized): Secondary | ICD-10-CM | POA: Diagnosis not present

## 2014-12-04 DIAGNOSIS — R262 Difficulty in walking, not elsewhere classified: Secondary | ICD-10-CM | POA: Diagnosis not present

## 2014-12-04 DIAGNOSIS — R2689 Other abnormalities of gait and mobility: Secondary | ICD-10-CM | POA: Diagnosis not present

## 2014-12-06 DIAGNOSIS — R262 Difficulty in walking, not elsewhere classified: Secondary | ICD-10-CM | POA: Diagnosis not present

## 2014-12-06 DIAGNOSIS — R2689 Other abnormalities of gait and mobility: Secondary | ICD-10-CM | POA: Diagnosis not present

## 2014-12-06 DIAGNOSIS — R2681 Unsteadiness on feet: Secondary | ICD-10-CM | POA: Diagnosis not present

## 2014-12-06 DIAGNOSIS — M6281 Muscle weakness (generalized): Secondary | ICD-10-CM | POA: Diagnosis not present

## 2014-12-11 DIAGNOSIS — R2689 Other abnormalities of gait and mobility: Secondary | ICD-10-CM | POA: Diagnosis not present

## 2014-12-11 DIAGNOSIS — R2681 Unsteadiness on feet: Secondary | ICD-10-CM | POA: Diagnosis not present

## 2014-12-11 DIAGNOSIS — M6281 Muscle weakness (generalized): Secondary | ICD-10-CM | POA: Diagnosis not present

## 2014-12-11 DIAGNOSIS — R262 Difficulty in walking, not elsewhere classified: Secondary | ICD-10-CM | POA: Diagnosis not present

## 2014-12-13 DIAGNOSIS — R2689 Other abnormalities of gait and mobility: Secondary | ICD-10-CM | POA: Diagnosis not present

## 2014-12-13 DIAGNOSIS — R2681 Unsteadiness on feet: Secondary | ICD-10-CM | POA: Diagnosis not present

## 2014-12-13 DIAGNOSIS — M6281 Muscle weakness (generalized): Secondary | ICD-10-CM | POA: Diagnosis not present

## 2014-12-13 DIAGNOSIS — R262 Difficulty in walking, not elsewhere classified: Secondary | ICD-10-CM | POA: Diagnosis not present

## 2014-12-19 DIAGNOSIS — R262 Difficulty in walking, not elsewhere classified: Secondary | ICD-10-CM | POA: Diagnosis not present

## 2014-12-19 DIAGNOSIS — R2681 Unsteadiness on feet: Secondary | ICD-10-CM | POA: Diagnosis not present

## 2014-12-19 DIAGNOSIS — R2689 Other abnormalities of gait and mobility: Secondary | ICD-10-CM | POA: Diagnosis not present

## 2014-12-19 DIAGNOSIS — M6281 Muscle weakness (generalized): Secondary | ICD-10-CM | POA: Diagnosis not present

## 2014-12-21 DIAGNOSIS — R2681 Unsteadiness on feet: Secondary | ICD-10-CM | POA: Diagnosis not present

## 2014-12-21 DIAGNOSIS — M6281 Muscle weakness (generalized): Secondary | ICD-10-CM | POA: Diagnosis not present

## 2014-12-21 DIAGNOSIS — R2689 Other abnormalities of gait and mobility: Secondary | ICD-10-CM | POA: Diagnosis not present

## 2014-12-21 DIAGNOSIS — R262 Difficulty in walking, not elsewhere classified: Secondary | ICD-10-CM | POA: Diagnosis not present

## 2014-12-25 DIAGNOSIS — R2689 Other abnormalities of gait and mobility: Secondary | ICD-10-CM | POA: Diagnosis not present

## 2014-12-25 DIAGNOSIS — R2681 Unsteadiness on feet: Secondary | ICD-10-CM | POA: Diagnosis not present

## 2014-12-25 DIAGNOSIS — M6281 Muscle weakness (generalized): Secondary | ICD-10-CM | POA: Diagnosis not present

## 2014-12-25 DIAGNOSIS — R262 Difficulty in walking, not elsewhere classified: Secondary | ICD-10-CM | POA: Diagnosis not present

## 2014-12-27 DIAGNOSIS — R262 Difficulty in walking, not elsewhere classified: Secondary | ICD-10-CM | POA: Diagnosis not present

## 2014-12-27 DIAGNOSIS — R2681 Unsteadiness on feet: Secondary | ICD-10-CM | POA: Diagnosis not present

## 2014-12-27 DIAGNOSIS — M6281 Muscle weakness (generalized): Secondary | ICD-10-CM | POA: Diagnosis not present

## 2014-12-27 DIAGNOSIS — R2689 Other abnormalities of gait and mobility: Secondary | ICD-10-CM | POA: Diagnosis not present

## 2015-01-07 DIAGNOSIS — N3091 Cystitis, unspecified with hematuria: Secondary | ICD-10-CM | POA: Diagnosis not present

## 2015-01-07 DIAGNOSIS — N419 Inflammatory disease of prostate, unspecified: Secondary | ICD-10-CM | POA: Diagnosis not present

## 2015-01-24 DIAGNOSIS — G4700 Insomnia, unspecified: Secondary | ICD-10-CM | POA: Diagnosis not present

## 2015-01-24 DIAGNOSIS — N401 Enlarged prostate with lower urinary tract symptoms: Secondary | ICD-10-CM | POA: Diagnosis not present

## 2015-01-24 DIAGNOSIS — I10 Essential (primary) hypertension: Secondary | ICD-10-CM | POA: Diagnosis not present

## 2015-01-24 DIAGNOSIS — R351 Nocturia: Secondary | ICD-10-CM | POA: Diagnosis not present

## 2015-02-22 DIAGNOSIS — Z6826 Body mass index (BMI) 26.0-26.9, adult: Secondary | ICD-10-CM | POA: Diagnosis not present

## 2015-02-22 DIAGNOSIS — I10 Essential (primary) hypertension: Secondary | ICD-10-CM | POA: Diagnosis not present

## 2015-04-01 DIAGNOSIS — R350 Frequency of micturition: Secondary | ICD-10-CM | POA: Diagnosis not present

## 2015-04-01 DIAGNOSIS — N3001 Acute cystitis with hematuria: Secondary | ICD-10-CM | POA: Diagnosis not present

## 2015-04-23 DIAGNOSIS — R5383 Other fatigue: Secondary | ICD-10-CM | POA: Diagnosis not present

## 2015-04-23 DIAGNOSIS — D72829 Elevated white blood cell count, unspecified: Secondary | ICD-10-CM | POA: Diagnosis not present

## 2015-04-23 DIAGNOSIS — R351 Nocturia: Secondary | ICD-10-CM | POA: Diagnosis not present

## 2015-04-23 DIAGNOSIS — R232 Flushing: Secondary | ICD-10-CM | POA: Diagnosis not present

## 2015-04-23 DIAGNOSIS — Z130 Encounter for screening for diseases of the blood and blood-forming organs and certain disorders involving the immune mechanism: Secondary | ICD-10-CM | POA: Diagnosis not present

## 2015-04-23 DIAGNOSIS — F419 Anxiety disorder, unspecified: Secondary | ICD-10-CM | POA: Diagnosis not present

## 2015-04-26 DIAGNOSIS — E871 Hypo-osmolality and hyponatremia: Secondary | ICD-10-CM | POA: Diagnosis not present

## 2015-04-26 DIAGNOSIS — E291 Testicular hypofunction: Secondary | ICD-10-CM | POA: Diagnosis not present

## 2015-04-26 DIAGNOSIS — D72829 Elevated white blood cell count, unspecified: Secondary | ICD-10-CM | POA: Diagnosis not present

## 2015-04-26 DIAGNOSIS — R351 Nocturia: Secondary | ICD-10-CM | POA: Diagnosis not present

## 2015-05-22 DIAGNOSIS — E871 Hypo-osmolality and hyponatremia: Secondary | ICD-10-CM | POA: Diagnosis not present

## 2015-05-22 DIAGNOSIS — M25461 Effusion, right knee: Secondary | ICD-10-CM | POA: Diagnosis not present

## 2015-05-22 DIAGNOSIS — Z6826 Body mass index (BMI) 26.0-26.9, adult: Secondary | ICD-10-CM | POA: Diagnosis not present

## 2015-05-22 DIAGNOSIS — D72829 Elevated white blood cell count, unspecified: Secondary | ICD-10-CM | POA: Diagnosis not present

## 2015-05-22 DIAGNOSIS — M25469 Effusion, unspecified knee: Secondary | ICD-10-CM | POA: Diagnosis not present

## 2015-06-05 DIAGNOSIS — Z9181 History of falling: Secondary | ICD-10-CM | POA: Diagnosis not present

## 2015-06-05 DIAGNOSIS — Z0000 Encounter for general adult medical examination without abnormal findings: Secondary | ICD-10-CM | POA: Diagnosis not present

## 2015-06-05 DIAGNOSIS — Z1389 Encounter for screening for other disorder: Secondary | ICD-10-CM | POA: Diagnosis not present

## 2015-06-05 DIAGNOSIS — Z139 Encounter for screening, unspecified: Secondary | ICD-10-CM | POA: Diagnosis not present

## 2015-06-26 DIAGNOSIS — D72829 Elevated white blood cell count, unspecified: Secondary | ICD-10-CM | POA: Diagnosis not present

## 2015-06-26 DIAGNOSIS — E871 Hypo-osmolality and hyponatremia: Secondary | ICD-10-CM | POA: Diagnosis not present

## 2015-08-03 DIAGNOSIS — D72829 Elevated white blood cell count, unspecified: Secondary | ICD-10-CM | POA: Diagnosis not present

## 2015-08-03 DIAGNOSIS — N390 Urinary tract infection, site not specified: Secondary | ICD-10-CM | POA: Diagnosis not present

## 2015-08-03 DIAGNOSIS — R350 Frequency of micturition: Secondary | ICD-10-CM | POA: Diagnosis not present

## 2016-02-04 DIAGNOSIS — I10 Essential (primary) hypertension: Secondary | ICD-10-CM | POA: Diagnosis not present

## 2016-02-04 DIAGNOSIS — R351 Nocturia: Secondary | ICD-10-CM | POA: Diagnosis not present

## 2016-02-04 DIAGNOSIS — F419 Anxiety disorder, unspecified: Secondary | ICD-10-CM | POA: Diagnosis not present

## 2016-02-04 DIAGNOSIS — N401 Enlarged prostate with lower urinary tract symptoms: Secondary | ICD-10-CM | POA: Diagnosis not present

## 2016-02-21 DIAGNOSIS — E871 Hypo-osmolality and hyponatremia: Secondary | ICD-10-CM | POA: Diagnosis not present

## 2016-04-17 DIAGNOSIS — M159 Polyosteoarthritis, unspecified: Secondary | ICD-10-CM | POA: Diagnosis not present

## 2016-04-17 DIAGNOSIS — Z6827 Body mass index (BMI) 27.0-27.9, adult: Secondary | ICD-10-CM | POA: Diagnosis not present

## 2016-05-05 DIAGNOSIS — Z7189 Other specified counseling: Secondary | ICD-10-CM | POA: Diagnosis not present

## 2016-05-05 DIAGNOSIS — H6123 Impacted cerumen, bilateral: Secondary | ICD-10-CM | POA: Diagnosis not present

## 2016-05-05 DIAGNOSIS — Z6827 Body mass index (BMI) 27.0-27.9, adult: Secondary | ICD-10-CM | POA: Diagnosis not present

## 2016-07-21 DIAGNOSIS — Z0000 Encounter for general adult medical examination without abnormal findings: Secondary | ICD-10-CM | POA: Diagnosis not present

## 2016-07-21 DIAGNOSIS — F419 Anxiety disorder, unspecified: Secondary | ICD-10-CM | POA: Diagnosis not present

## 2016-07-21 DIAGNOSIS — N400 Enlarged prostate without lower urinary tract symptoms: Secondary | ICD-10-CM | POA: Diagnosis not present

## 2016-07-21 DIAGNOSIS — I10 Essential (primary) hypertension: Secondary | ICD-10-CM | POA: Diagnosis not present

## 2017-02-02 DIAGNOSIS — H6123 Impacted cerumen, bilateral: Secondary | ICD-10-CM | POA: Diagnosis not present

## 2017-02-02 DIAGNOSIS — H9193 Unspecified hearing loss, bilateral: Secondary | ICD-10-CM | POA: Diagnosis not present

## 2017-02-02 DIAGNOSIS — Z1389 Encounter for screening for other disorder: Secondary | ICD-10-CM | POA: Diagnosis not present

## 2017-02-02 DIAGNOSIS — F419 Anxiety disorder, unspecified: Secondary | ICD-10-CM | POA: Diagnosis not present

## 2017-02-02 DIAGNOSIS — F3160 Bipolar disorder, current episode mixed, unspecified: Secondary | ICD-10-CM | POA: Diagnosis not present

## 2017-02-02 DIAGNOSIS — Z139 Encounter for screening, unspecified: Secondary | ICD-10-CM | POA: Diagnosis not present

## 2017-02-02 DIAGNOSIS — M79609 Pain in unspecified limb: Secondary | ICD-10-CM | POA: Diagnosis not present

## 2017-02-02 DIAGNOSIS — I10 Essential (primary) hypertension: Secondary | ICD-10-CM | POA: Diagnosis not present

## 2017-02-20 ENCOUNTER — Other Ambulatory Visit: Payer: Self-pay

## 2017-07-20 DIAGNOSIS — F419 Anxiety disorder, unspecified: Secondary | ICD-10-CM | POA: Diagnosis not present

## 2017-07-20 DIAGNOSIS — Z6826 Body mass index (BMI) 26.0-26.9, adult: Secondary | ICD-10-CM | POA: Diagnosis not present

## 2017-07-20 DIAGNOSIS — N400 Enlarged prostate without lower urinary tract symptoms: Secondary | ICD-10-CM | POA: Diagnosis not present

## 2017-07-20 DIAGNOSIS — I10 Essential (primary) hypertension: Secondary | ICD-10-CM | POA: Diagnosis not present

## 2017-07-30 DIAGNOSIS — Z1331 Encounter for screening for depression: Secondary | ICD-10-CM | POA: Diagnosis not present

## 2017-07-30 DIAGNOSIS — Z9181 History of falling: Secondary | ICD-10-CM | POA: Diagnosis not present

## 2017-07-30 DIAGNOSIS — Z139 Encounter for screening, unspecified: Secondary | ICD-10-CM | POA: Diagnosis not present

## 2017-07-30 DIAGNOSIS — Z0000 Encounter for general adult medical examination without abnormal findings: Secondary | ICD-10-CM | POA: Diagnosis not present

## 2018-01-11 DIAGNOSIS — I10 Essential (primary) hypertension: Secondary | ICD-10-CM | POA: Diagnosis not present

## 2018-01-18 DIAGNOSIS — I10 Essential (primary) hypertension: Secondary | ICD-10-CM | POA: Diagnosis not present

## 2018-01-18 DIAGNOSIS — F419 Anxiety disorder, unspecified: Secondary | ICD-10-CM | POA: Diagnosis not present

## 2018-01-18 DIAGNOSIS — N400 Enlarged prostate without lower urinary tract symptoms: Secondary | ICD-10-CM | POA: Diagnosis not present

## 2018-01-18 DIAGNOSIS — R7301 Impaired fasting glucose: Secondary | ICD-10-CM | POA: Diagnosis not present

## 2018-05-28 DIAGNOSIS — M25552 Pain in left hip: Secondary | ICD-10-CM | POA: Diagnosis not present

## 2018-05-28 DIAGNOSIS — M25562 Pain in left knee: Secondary | ICD-10-CM | POA: Diagnosis not present

## 2018-05-28 DIAGNOSIS — Z79899 Other long term (current) drug therapy: Secondary | ICD-10-CM | POA: Diagnosis not present

## 2018-05-28 DIAGNOSIS — M79662 Pain in left lower leg: Secondary | ICD-10-CM | POA: Diagnosis not present

## 2018-05-28 DIAGNOSIS — Z7982 Long term (current) use of aspirin: Secondary | ICD-10-CM | POA: Diagnosis not present

## 2018-06-11 DIAGNOSIS — M25562 Pain in left knee: Secondary | ICD-10-CM | POA: Diagnosis not present

## 2018-06-11 DIAGNOSIS — Z6827 Body mass index (BMI) 27.0-27.9, adult: Secondary | ICD-10-CM | POA: Diagnosis not present

## 2018-06-11 DIAGNOSIS — D72829 Elevated white blood cell count, unspecified: Secondary | ICD-10-CM | POA: Diagnosis not present

## 2018-06-11 DIAGNOSIS — Z139 Encounter for screening, unspecified: Secondary | ICD-10-CM | POA: Diagnosis not present

## 2018-07-12 DIAGNOSIS — R7301 Impaired fasting glucose: Secondary | ICD-10-CM | POA: Diagnosis not present

## 2018-07-12 DIAGNOSIS — I10 Essential (primary) hypertension: Secondary | ICD-10-CM | POA: Diagnosis not present

## 2018-07-19 DIAGNOSIS — N400 Enlarged prostate without lower urinary tract symptoms: Secondary | ICD-10-CM | POA: Diagnosis not present

## 2018-07-19 DIAGNOSIS — I10 Essential (primary) hypertension: Secondary | ICD-10-CM | POA: Diagnosis not present

## 2018-07-19 DIAGNOSIS — Z6827 Body mass index (BMI) 27.0-27.9, adult: Secondary | ICD-10-CM | POA: Diagnosis not present

## 2018-07-19 DIAGNOSIS — R7301 Impaired fasting glucose: Secondary | ICD-10-CM | POA: Diagnosis not present

## 2019-01-10 DIAGNOSIS — R7301 Impaired fasting glucose: Secondary | ICD-10-CM | POA: Diagnosis not present

## 2019-01-10 DIAGNOSIS — I10 Essential (primary) hypertension: Secondary | ICD-10-CM | POA: Diagnosis not present

## 2019-01-17 DIAGNOSIS — R0902 Hypoxemia: Secondary | ICD-10-CM | POA: Diagnosis not present

## 2019-01-17 DIAGNOSIS — R7303 Prediabetes: Secondary | ICD-10-CM | POA: Diagnosis not present

## 2019-01-17 DIAGNOSIS — E871 Hypo-osmolality and hyponatremia: Secondary | ICD-10-CM | POA: Diagnosis not present

## 2019-01-17 DIAGNOSIS — Z0000 Encounter for general adult medical examination without abnormal findings: Secondary | ICD-10-CM | POA: Diagnosis not present

## 2019-01-17 DIAGNOSIS — I10 Essential (primary) hypertension: Secondary | ICD-10-CM | POA: Diagnosis not present

## 2019-01-31 DIAGNOSIS — Z6827 Body mass index (BMI) 27.0-27.9, adult: Secondary | ICD-10-CM | POA: Diagnosis not present

## 2019-01-31 DIAGNOSIS — E871 Hypo-osmolality and hyponatremia: Secondary | ICD-10-CM | POA: Diagnosis not present

## 2019-01-31 DIAGNOSIS — R7303 Prediabetes: Secondary | ICD-10-CM | POA: Diagnosis not present

## 2019-01-31 DIAGNOSIS — R0902 Hypoxemia: Secondary | ICD-10-CM | POA: Diagnosis not present

## 2019-02-09 DIAGNOSIS — K5900 Constipation, unspecified: Secondary | ICD-10-CM | POA: Diagnosis not present

## 2019-02-09 DIAGNOSIS — W19XXXA Unspecified fall, initial encounter: Secondary | ICD-10-CM | POA: Diagnosis not present

## 2019-02-09 DIAGNOSIS — R945 Abnormal results of liver function studies: Secondary | ICD-10-CM | POA: Diagnosis not present

## 2019-02-09 DIAGNOSIS — E872 Acidosis: Secondary | ICD-10-CM | POA: Diagnosis not present

## 2019-02-09 DIAGNOSIS — S3991XA Unspecified injury of abdomen, initial encounter: Secondary | ICD-10-CM | POA: Diagnosis not present

## 2019-02-09 DIAGNOSIS — Z79899 Other long term (current) drug therapy: Secondary | ICD-10-CM | POA: Diagnosis not present

## 2019-02-09 DIAGNOSIS — S299XXA Unspecified injury of thorax, initial encounter: Secondary | ICD-10-CM | POA: Diagnosis not present

## 2019-02-09 DIAGNOSIS — R0902 Hypoxemia: Secondary | ICD-10-CM | POA: Diagnosis present

## 2019-02-09 DIAGNOSIS — R739 Hyperglycemia, unspecified: Secondary | ICD-10-CM | POA: Diagnosis not present

## 2019-02-09 DIAGNOSIS — E871 Hypo-osmolality and hyponatremia: Secondary | ICD-10-CM | POA: Diagnosis not present

## 2019-02-09 DIAGNOSIS — R471 Dysarthria and anarthria: Secondary | ICD-10-CM | POA: Diagnosis present

## 2019-02-09 DIAGNOSIS — M1990 Unspecified osteoarthritis, unspecified site: Secondary | ICD-10-CM | POA: Diagnosis present

## 2019-02-09 DIAGNOSIS — Z23 Encounter for immunization: Secondary | ICD-10-CM | POA: Diagnosis not present

## 2019-02-09 DIAGNOSIS — I340 Nonrheumatic mitral (valve) insufficiency: Secondary | ICD-10-CM | POA: Diagnosis not present

## 2019-02-09 DIAGNOSIS — M6282 Rhabdomyolysis: Secondary | ICD-10-CM | POA: Diagnosis not present

## 2019-02-09 DIAGNOSIS — D72829 Elevated white blood cell count, unspecified: Secondary | ICD-10-CM | POA: Diagnosis not present

## 2019-02-09 DIAGNOSIS — R600 Localized edema: Secondary | ICD-10-CM | POA: Diagnosis not present

## 2019-02-09 DIAGNOSIS — R748 Abnormal levels of other serum enzymes: Secondary | ICD-10-CM | POA: Diagnosis not present

## 2019-02-09 DIAGNOSIS — Z743 Need for continuous supervision: Secondary | ICD-10-CM | POA: Diagnosis not present

## 2019-02-09 DIAGNOSIS — S0990XA Unspecified injury of head, initial encounter: Secondary | ICD-10-CM | POA: Diagnosis not present

## 2019-02-09 DIAGNOSIS — R531 Weakness: Secondary | ICD-10-CM | POA: Diagnosis not present

## 2019-02-09 DIAGNOSIS — R4781 Slurred speech: Secondary | ICD-10-CM | POA: Diagnosis not present

## 2019-02-09 DIAGNOSIS — I21A1 Myocardial infarction type 2: Secondary | ICD-10-CM | POA: Diagnosis not present

## 2019-02-09 DIAGNOSIS — I10 Essential (primary) hypertension: Secondary | ICD-10-CM | POA: Diagnosis not present

## 2019-02-09 DIAGNOSIS — E860 Dehydration: Secondary | ICD-10-CM | POA: Diagnosis present

## 2019-02-09 DIAGNOSIS — R6510 Systemic inflammatory response syndrome (SIRS) of non-infectious origin without acute organ dysfunction: Secondary | ICD-10-CM | POA: Diagnosis not present

## 2019-02-09 DIAGNOSIS — I361 Nonrheumatic tricuspid (valve) insufficiency: Secondary | ICD-10-CM | POA: Diagnosis not present

## 2019-02-09 DIAGNOSIS — R29818 Other symptoms and signs involving the nervous system: Secondary | ICD-10-CM | POA: Diagnosis not present

## 2019-02-09 DIAGNOSIS — I214 Non-ST elevation (NSTEMI) myocardial infarction: Secondary | ICD-10-CM | POA: Diagnosis not present

## 2019-02-09 DIAGNOSIS — Z7982 Long term (current) use of aspirin: Secondary | ICD-10-CM | POA: Diagnosis not present

## 2019-02-09 DIAGNOSIS — R000 Tachycardia, unspecified: Secondary | ICD-10-CM | POA: Diagnosis not present

## 2019-02-09 DIAGNOSIS — E8770 Fluid overload, unspecified: Secondary | ICD-10-CM | POA: Diagnosis present

## 2019-02-09 DIAGNOSIS — R740 Nonspecific elevation of levels of transaminase and lactic acid dehydrogenase [LDH]: Secondary | ICD-10-CM | POA: Diagnosis present

## 2019-02-09 DIAGNOSIS — S199XXA Unspecified injury of neck, initial encounter: Secondary | ICD-10-CM | POA: Diagnosis not present

## 2019-02-09 DIAGNOSIS — I959 Hypotension, unspecified: Secondary | ICD-10-CM | POA: Diagnosis not present

## 2019-02-10 DIAGNOSIS — R4781 Slurred speech: Secondary | ICD-10-CM

## 2019-02-10 DIAGNOSIS — I10 Essential (primary) hypertension: Secondary | ICD-10-CM

## 2019-02-10 DIAGNOSIS — M6282 Rhabdomyolysis: Secondary | ICD-10-CM

## 2019-02-10 DIAGNOSIS — I214 Non-ST elevation (NSTEMI) myocardial infarction: Secondary | ICD-10-CM

## 2019-02-10 DIAGNOSIS — R531 Weakness: Secondary | ICD-10-CM

## 2019-02-11 ENCOUNTER — Other Ambulatory Visit: Payer: Self-pay | Admitting: *Deleted

## 2019-02-11 DIAGNOSIS — R002 Palpitations: Secondary | ICD-10-CM

## 2019-02-17 DIAGNOSIS — M6282 Rhabdomyolysis: Secondary | ICD-10-CM | POA: Diagnosis not present

## 2019-02-17 DIAGNOSIS — I959 Hypotension, unspecified: Secondary | ICD-10-CM | POA: Diagnosis not present

## 2019-02-17 DIAGNOSIS — I10 Essential (primary) hypertension: Secondary | ICD-10-CM | POA: Diagnosis present

## 2019-02-17 DIAGNOSIS — K648 Other hemorrhoids: Secondary | ICD-10-CM | POA: Diagnosis present

## 2019-02-17 DIAGNOSIS — R0902 Hypoxemia: Secondary | ICD-10-CM | POA: Diagnosis not present

## 2019-02-17 DIAGNOSIS — R195 Other fecal abnormalities: Secondary | ICD-10-CM | POA: Diagnosis not present

## 2019-02-17 DIAGNOSIS — R4781 Slurred speech: Secondary | ICD-10-CM | POA: Diagnosis not present

## 2019-02-17 DIAGNOSIS — E871 Hypo-osmolality and hyponatremia: Secondary | ICD-10-CM | POA: Diagnosis not present

## 2019-02-17 DIAGNOSIS — J9811 Atelectasis: Secondary | ICD-10-CM | POA: Diagnosis present

## 2019-02-17 DIAGNOSIS — I21A1 Myocardial infarction type 2: Secondary | ICD-10-CM | POA: Diagnosis present

## 2019-02-17 DIAGNOSIS — K5900 Constipation, unspecified: Secondary | ICD-10-CM | POA: Diagnosis not present

## 2019-02-17 DIAGNOSIS — R471 Dysarthria and anarthria: Secondary | ICD-10-CM | POA: Diagnosis not present

## 2019-02-17 DIAGNOSIS — T380X5A Adverse effect of glucocorticoids and synthetic analogues, initial encounter: Secondary | ICD-10-CM | POA: Diagnosis present

## 2019-02-17 DIAGNOSIS — K922 Gastrointestinal hemorrhage, unspecified: Secondary | ICD-10-CM | POA: Diagnosis not present

## 2019-02-17 DIAGNOSIS — N390 Urinary tract infection, site not specified: Secondary | ICD-10-CM | POA: Diagnosis present

## 2019-02-17 DIAGNOSIS — R6510 Systemic inflammatory response syndrome (SIRS) of non-infectious origin without acute organ dysfunction: Secondary | ICD-10-CM | POA: Diagnosis not present

## 2019-02-17 DIAGNOSIS — T796XXA Traumatic ischemia of muscle, initial encounter: Secondary | ICD-10-CM | POA: Diagnosis present

## 2019-02-17 DIAGNOSIS — K921 Melena: Secondary | ICD-10-CM | POA: Diagnosis present

## 2019-02-17 DIAGNOSIS — F329 Major depressive disorder, single episode, unspecified: Secondary | ICD-10-CM | POA: Diagnosis not present

## 2019-02-17 DIAGNOSIS — R740 Nonspecific elevation of levels of transaminase and lactic acid dehydrogenase [LDH]: Secondary | ICD-10-CM | POA: Diagnosis not present

## 2019-02-17 DIAGNOSIS — R58 Hemorrhage, not elsewhere classified: Secondary | ICD-10-CM | POA: Diagnosis not present

## 2019-02-17 DIAGNOSIS — E860 Dehydration: Secondary | ICD-10-CM | POA: Diagnosis not present

## 2019-02-17 DIAGNOSIS — Z7952 Long term (current) use of systemic steroids: Secondary | ICD-10-CM | POA: Diagnosis not present

## 2019-02-17 DIAGNOSIS — K5730 Diverticulosis of large intestine without perforation or abscess without bleeding: Secondary | ICD-10-CM | POA: Diagnosis present

## 2019-02-17 DIAGNOSIS — Z9981 Dependence on supplemental oxygen: Secondary | ICD-10-CM | POA: Diagnosis not present

## 2019-02-17 DIAGNOSIS — Z743 Need for continuous supervision: Secondary | ICD-10-CM | POA: Diagnosis not present

## 2019-02-17 DIAGNOSIS — Z20828 Contact with and (suspected) exposure to other viral communicable diseases: Secondary | ICD-10-CM | POA: Diagnosis present

## 2019-02-17 DIAGNOSIS — E8770 Fluid overload, unspecified: Secondary | ICD-10-CM | POA: Diagnosis not present

## 2019-02-17 DIAGNOSIS — F419 Anxiety disorder, unspecified: Secondary | ICD-10-CM | POA: Diagnosis present

## 2019-02-17 DIAGNOSIS — M609 Myositis, unspecified: Secondary | ICD-10-CM | POA: Diagnosis not present

## 2019-02-17 DIAGNOSIS — R2681 Unsteadiness on feet: Secondary | ICD-10-CM | POA: Diagnosis not present

## 2019-02-17 DIAGNOSIS — I2510 Atherosclerotic heart disease of native coronary artery without angina pectoris: Secondary | ICD-10-CM | POA: Diagnosis not present

## 2019-02-17 DIAGNOSIS — Z7982 Long term (current) use of aspirin: Secondary | ICD-10-CM | POA: Diagnosis not present

## 2019-02-17 DIAGNOSIS — D62 Acute posthemorrhagic anemia: Secondary | ICD-10-CM | POA: Diagnosis present

## 2019-02-17 DIAGNOSIS — Z79899 Other long term (current) drug therapy: Secondary | ICD-10-CM | POA: Diagnosis not present

## 2019-02-17 DIAGNOSIS — M1991 Primary osteoarthritis, unspecified site: Secondary | ICD-10-CM | POA: Diagnosis not present

## 2019-02-17 DIAGNOSIS — D649 Anemia, unspecified: Secondary | ICD-10-CM | POA: Diagnosis not present

## 2019-02-17 DIAGNOSIS — I214 Non-ST elevation (NSTEMI) myocardial infarction: Secondary | ICD-10-CM | POA: Diagnosis not present

## 2019-02-17 DIAGNOSIS — J9691 Respiratory failure, unspecified with hypoxia: Secondary | ICD-10-CM | POA: Diagnosis not present

## 2019-02-17 DIAGNOSIS — R739 Hyperglycemia, unspecified: Secondary | ICD-10-CM | POA: Diagnosis present

## 2019-02-17 DIAGNOSIS — D72829 Elevated white blood cell count, unspecified: Secondary | ICD-10-CM | POA: Diagnosis not present

## 2019-02-17 DIAGNOSIS — K3189 Other diseases of stomach and duodenum: Secondary | ICD-10-CM | POA: Diagnosis not present

## 2019-02-17 DIAGNOSIS — M1990 Unspecified osteoarthritis, unspecified site: Secondary | ICD-10-CM | POA: Diagnosis present

## 2019-02-17 DIAGNOSIS — R05 Cough: Secondary | ICD-10-CM | POA: Diagnosis not present

## 2019-02-17 DIAGNOSIS — R531 Weakness: Secondary | ICD-10-CM | POA: Diagnosis not present

## 2019-02-17 DIAGNOSIS — N400 Enlarged prostate without lower urinary tract symptoms: Secondary | ICD-10-CM | POA: Diagnosis present

## 2019-02-17 DIAGNOSIS — R21 Rash and other nonspecific skin eruption: Secondary | ICD-10-CM | POA: Diagnosis present

## 2019-02-17 DIAGNOSIS — Z23 Encounter for immunization: Secondary | ICD-10-CM | POA: Diagnosis not present

## 2019-02-20 DIAGNOSIS — M6282 Rhabdomyolysis: Secondary | ICD-10-CM | POA: Diagnosis not present

## 2019-02-20 DIAGNOSIS — F329 Major depressive disorder, single episode, unspecified: Secondary | ICD-10-CM | POA: Diagnosis not present

## 2019-02-20 DIAGNOSIS — M609 Myositis, unspecified: Secondary | ICD-10-CM | POA: Diagnosis not present

## 2019-02-20 DIAGNOSIS — R2681 Unsteadiness on feet: Secondary | ICD-10-CM | POA: Diagnosis not present

## 2019-02-20 DIAGNOSIS — R21 Rash and other nonspecific skin eruption: Secondary | ICD-10-CM | POA: Diagnosis not present

## 2019-02-20 DIAGNOSIS — R05 Cough: Secondary | ICD-10-CM | POA: Diagnosis not present

## 2019-02-20 DIAGNOSIS — I214 Non-ST elevation (NSTEMI) myocardial infarction: Secondary | ICD-10-CM | POA: Diagnosis not present

## 2019-02-25 ENCOUNTER — Other Ambulatory Visit: Payer: Self-pay | Admitting: *Deleted

## 2019-02-25 NOTE — Patient Outreach (Signed)
Member assessed for potential [facility] Care Management needs as a benefit of  [employer] [employer].  Member is currently receiving rehab therapy at [facility].  Made (covering) [facility] UM RN aware that writer will follow along for potential [facility] Care Management needs, progression, and disposition plans.   Marthenia Rolling, MSN-Ed, RN,BSN [place] Acute Care Coordinator [phone] [facility]) [phone]  (Toll free office)

## 2019-03-05 DIAGNOSIS — D649 Anemia, unspecified: Secondary | ICD-10-CM | POA: Diagnosis not present

## 2019-03-05 DIAGNOSIS — M6282 Rhabdomyolysis: Secondary | ICD-10-CM | POA: Diagnosis not present

## 2019-03-05 DIAGNOSIS — I2510 Atherosclerotic heart disease of native coronary artery without angina pectoris: Secondary | ICD-10-CM | POA: Diagnosis not present

## 2019-03-07 ENCOUNTER — Observation Stay (HOSPITAL_COMMUNITY): Payer: Medicare Other

## 2019-03-07 ENCOUNTER — Other Ambulatory Visit: Payer: Self-pay

## 2019-03-07 ENCOUNTER — Inpatient Hospital Stay (HOSPITAL_COMMUNITY)
Admission: EM | Admit: 2019-03-07 | Discharge: 2019-03-11 | DRG: 811 | Disposition: A | Discharge status: Skilled Nursing Facility | Payer: Medicare Other | Attending: Internal Medicine | Admitting: Internal Medicine

## 2019-03-07 ENCOUNTER — Encounter (HOSPITAL_COMMUNITY): Payer: Self-pay | Admitting: Internal Medicine

## 2019-03-07 DIAGNOSIS — R739 Hyperglycemia, unspecified: Secondary | ICD-10-CM | POA: Diagnosis present

## 2019-03-07 DIAGNOSIS — E871 Hypo-osmolality and hyponatremia: Secondary | ICD-10-CM | POA: Diagnosis present

## 2019-03-07 DIAGNOSIS — D62 Acute posthemorrhagic anemia: Principal | ICD-10-CM | POA: Diagnosis present

## 2019-03-07 DIAGNOSIS — K922 Gastrointestinal hemorrhage, unspecified: Secondary | ICD-10-CM | POA: Diagnosis present

## 2019-03-07 DIAGNOSIS — Z20828 Contact with and (suspected) exposure to other viral communicable diseases: Secondary | ICD-10-CM | POA: Diagnosis present

## 2019-03-07 DIAGNOSIS — Z79899 Other long term (current) drug therapy: Secondary | ICD-10-CM

## 2019-03-07 DIAGNOSIS — D72829 Elevated white blood cell count, unspecified: Secondary | ICD-10-CM | POA: Diagnosis present

## 2019-03-07 DIAGNOSIS — F419 Anxiety disorder, unspecified: Secondary | ICD-10-CM | POA: Diagnosis present

## 2019-03-07 DIAGNOSIS — J9691 Respiratory failure, unspecified with hypoxia: Secondary | ICD-10-CM | POA: Diagnosis present

## 2019-03-07 DIAGNOSIS — K648 Other hemorrhoids: Secondary | ICD-10-CM | POA: Diagnosis present

## 2019-03-07 DIAGNOSIS — R58 Hemorrhage, not elsewhere classified: Secondary | ICD-10-CM | POA: Diagnosis not present

## 2019-03-07 DIAGNOSIS — K921 Melena: Secondary | ICD-10-CM | POA: Diagnosis present

## 2019-03-07 DIAGNOSIS — Z9981 Dependence on supplemental oxygen: Secondary | ICD-10-CM

## 2019-03-07 DIAGNOSIS — J9811 Atelectasis: Secondary | ICD-10-CM | POA: Diagnosis present

## 2019-03-07 DIAGNOSIS — I959 Hypotension, unspecified: Secondary | ICD-10-CM | POA: Diagnosis not present

## 2019-03-07 DIAGNOSIS — N390 Urinary tract infection, site not specified: Secondary | ICD-10-CM | POA: Diagnosis present

## 2019-03-07 DIAGNOSIS — D649 Anemia, unspecified: Secondary | ICD-10-CM | POA: Diagnosis not present

## 2019-03-07 DIAGNOSIS — M1990 Unspecified osteoarthritis, unspecified site: Secondary | ICD-10-CM | POA: Diagnosis present

## 2019-03-07 DIAGNOSIS — R195 Other fecal abnormalities: Secondary | ICD-10-CM

## 2019-03-07 DIAGNOSIS — N400 Enlarged prostate without lower urinary tract symptoms: Secondary | ICD-10-CM | POA: Diagnosis not present

## 2019-03-07 DIAGNOSIS — T796XXA Traumatic ischemia of muscle, initial encounter: Secondary | ICD-10-CM | POA: Diagnosis present

## 2019-03-07 DIAGNOSIS — I10 Essential (primary) hypertension: Secondary | ICD-10-CM | POA: Diagnosis present

## 2019-03-07 DIAGNOSIS — R531 Weakness: Secondary | ICD-10-CM | POA: Diagnosis not present

## 2019-03-07 DIAGNOSIS — T380X5A Adverse effect of glucocorticoids and synthetic analogues, initial encounter: Secondary | ICD-10-CM | POA: Diagnosis present

## 2019-03-07 DIAGNOSIS — R21 Rash and other nonspecific skin eruption: Secondary | ICD-10-CM | POA: Diagnosis present

## 2019-03-07 DIAGNOSIS — K5730 Diverticulosis of large intestine without perforation or abscess without bleeding: Secondary | ICD-10-CM | POA: Diagnosis present

## 2019-03-07 DIAGNOSIS — Z7952 Long term (current) use of systemic steroids: Secondary | ICD-10-CM

## 2019-03-07 DIAGNOSIS — I21A1 Myocardial infarction type 2: Secondary | ICD-10-CM | POA: Diagnosis present

## 2019-03-07 HISTORY — DX: Hypo-osmolality and hyponatremia: E87.1

## 2019-03-07 HISTORY — DX: Unspecified osteoarthritis, unspecified site: M19.90

## 2019-03-07 HISTORY — DX: Urinary tract infection, site not specified: N39.0

## 2019-03-07 HISTORY — DX: Acute posthemorrhagic anemia: D62

## 2019-03-07 HISTORY — DX: Anxiety disorder, unspecified: F41.9

## 2019-03-07 HISTORY — DX: Non-ST elevation (NSTEMI) myocardial infarction: I21.4

## 2019-03-07 HISTORY — DX: Benign prostatic hyperplasia without lower urinary tract symptoms: N40.0

## 2019-03-07 HISTORY — DX: Rhabdomyolysis: M62.82

## 2019-03-07 HISTORY — DX: Essential (primary) hypertension: I10

## 2019-03-07 HISTORY — DX: Gastrointestinal hemorrhage, unspecified: K92.2

## 2019-03-07 LAB — COMPREHENSIVE METABOLIC PANEL
ALT: 27 U/L (ref 0–44)
AST: 22 U/L (ref 15–41)
Albumin: 2.7 g/dL — ABNORMAL LOW (ref 3.5–5.0)
Alkaline Phosphatase: 39 U/L (ref 38–126)
Anion gap: 9 (ref 5–15)
BUN: 19 mg/dL (ref 8–23)
CO2: 24 mmol/L (ref 22–32)
Calcium: 8 mg/dL — ABNORMAL LOW (ref 8.9–10.3)
Chloride: 94 mmol/L — ABNORMAL LOW (ref 98–111)
Creatinine, Ser: 0.73 mg/dL (ref 0.61–1.24)
GFR calc Af Amer: >60 mL/min (ref >60)
GFR calc non Af Amer: >60 mL/min (ref >60)
Glucose, Bld: 157 mg/dL — ABNORMAL HIGH (ref 70–99)
Potassium: 5 mmol/L (ref 3.5–5.1)
Sodium: 127 mmol/L — ABNORMAL LOW (ref 135–145)
Total Bilirubin: 0.5 mg/dL (ref 0.3–1.2)
Total Protein: 4.6 g/dL — ABNORMAL LOW (ref 6.5–8.1)

## 2019-03-07 LAB — URINALYSIS, ROUTINE W REFLEX MICROSCOPIC
Bilirubin Urine: NEGATIVE
Glucose, UA: NEGATIVE mg/dL
Hgb urine dipstick: NEGATIVE
Ketones, ur: NEGATIVE mg/dL
Leukocytes,Ua: NEGATIVE
Nitrite: NEGATIVE
Protein, ur: NEGATIVE mg/dL
Specific Gravity, Urine: 1.012 (ref 1.005–1.030)
pH: 5 (ref 5.0–8.0)

## 2019-03-07 LAB — CBC
HCT: 18.5 % — ABNORMAL LOW (ref 39.0–52.0)
Hemoglobin: 5.8 g/dL — CL (ref 13.0–17.0)
MCH: 31.2 pg (ref 26.0–34.0)
MCHC: 31.4 g/dL (ref 30.0–36.0)
MCV: 99.5 fL (ref 80.0–100.0)
Platelets: 291 10*3/uL (ref 150–400)
RBC: 1.86 MIL/uL — ABNORMAL LOW (ref 4.22–5.81)
RDW: 15.9 % — ABNORMAL HIGH (ref 11.5–15.5)
WBC: 20.7 10*3/uL — ABNORMAL HIGH (ref 4.0–10.5)
nRBC: 0 % (ref 0.0–0.2)

## 2019-03-07 LAB — PREPARE RBC (CROSSMATCH)

## 2019-03-07 LAB — ABO/RH: ABO/RH(D): A POS

## 2019-03-07 LAB — SARS CORONAVIRUS 2 BY RT PCR (HOSPITAL ORDER, PERFORMED IN ~~LOC~~ HOSPITAL LAB): SARS Coronavirus 2: NEGATIVE

## 2019-03-07 MED ORDER — ALBUTEROL SULFATE (2.5 MG/3ML) 0.083% IN NEBU: 2.5000 mg | INHALATION_SOLUTION | Freq: Four times a day (QID) | RESPIRATORY_TRACT | Status: DC | PRN

## 2019-03-07 MED ORDER — PREDNISONE 20 MG PO TABS
30.0000 mg | ORAL_TABLET | Freq: Every day | ORAL | Status: AC
  Administered 2019-03-08: 30 mg via ORAL
  Filled 2019-03-07: qty 1

## 2019-03-07 MED ORDER — FLUOXETINE HCL 10 MG PO CAPS
10.0000 mg | ORAL_CAPSULE | Freq: Every day | ORAL | Status: DC
  Administered 2019-03-08 – 2019-03-11 (×4): 10 mg via ORAL
  Filled 2019-03-07 (×4): qty 1

## 2019-03-07 MED ORDER — SODIUM CHLORIDE 0.9 % IV SOLN: 10.0000 mL/h | Freq: Once | INTRAVENOUS | Status: DC

## 2019-03-07 MED ORDER — TAMSULOSIN HCL 0.4 MG PO CAPS
0.4000 mg | ORAL_CAPSULE | Freq: Every day | ORAL | Status: DC
  Administered 2019-03-08 – 2019-03-11 (×4): 0.4 mg via ORAL
  Filled 2019-03-07 (×4): qty 1

## 2019-03-07 MED ORDER — FINASTERIDE 5 MG PO TABS
5.0000 mg | ORAL_TABLET | Freq: Every day | ORAL | Status: DC
  Administered 2019-03-08 – 2019-03-11 (×4): 5 mg via ORAL
  Filled 2019-03-07 (×4): qty 1

## 2019-03-07 MED ORDER — LOSARTAN POTASSIUM 50 MG PO TABS
50.0000 mg | ORAL_TABLET | Freq: Every day | ORAL | Status: DC
  Administered 2019-03-08 – 2019-03-11 (×4): 50 mg via ORAL
  Filled 2019-03-07 (×4): qty 1

## 2019-03-07 MED ORDER — PANTOPRAZOLE SODIUM 40 MG IV SOLR
40.0000 mg | Freq: Once | INTRAVENOUS | Status: AC
  Administered 2019-03-07: 40 mg via INTRAVENOUS
  Filled 2019-03-07: qty 40

## 2019-03-07 MED ORDER — ACETAMINOPHEN 650 MG RE SUPP: 650.0000 mg | Freq: Four times a day (QID) | RECTAL | Status: DC | PRN

## 2019-03-07 MED ORDER — PANTOPRAZOLE SODIUM 40 MG PO TBEC
40.0000 mg | DELAYED_RELEASE_TABLET | Freq: Two times a day (BID) | ORAL | Status: DC
  Administered 2019-03-08 – 2019-03-09 (×3): 40 mg via ORAL
  Filled 2019-03-07 (×2): qty 1

## 2019-03-07 MED ORDER — ACETAMINOPHEN 325 MG PO TABS
650.0000 mg | ORAL_TABLET | Freq: Four times a day (QID) | ORAL | Status: DC | PRN
  Administered 2019-03-11: 650 mg via ORAL

## 2019-03-07 MED ORDER — ONDANSETRON HCL 4 MG PO TABS: 4.0000 mg | ORAL_TABLET | Freq: Four times a day (QID) | ORAL | Status: DC | PRN

## 2019-03-07 MED ORDER — AMLODIPINE BESYLATE 10 MG PO TABS
10.0000 mg | ORAL_TABLET | ORAL | Status: DC
  Administered 2019-03-09 – 2019-03-11 (×2): 10 mg via ORAL
  Filled 2019-03-07 (×2): qty 1

## 2019-03-07 MED ORDER — TRAZODONE HCL 50 MG PO TABS
50.0000 mg | ORAL_TABLET | Freq: Every day | ORAL | Status: DC
  Administered 2019-03-07 – 2019-03-10 (×4): 50 mg via ORAL
  Filled 2019-03-07 (×4): qty 1

## 2019-03-07 MED ORDER — ONDANSETRON HCL 4 MG/2ML IJ SOLN: 4.0000 mg | Freq: Four times a day (QID) | INTRAMUSCULAR | Status: DC | PRN

## 2019-03-07 MED ORDER — PREDNISONE 20 MG PO TABS
20.0000 mg | ORAL_TABLET | Freq: Every day | ORAL | Status: DC
  Administered 2019-03-09 – 2019-03-11 (×3): 20 mg via ORAL
  Filled 2019-03-07 (×3): qty 1

## 2019-03-07 MED ORDER — PREDNISONE 20 MG PO TABS: 20.0000 mg | ORAL_TABLET | ORAL | Status: DC

## 2019-03-07 MED ORDER — SODIUM CHLORIDE 0.9 % IV SOLN
INTRAVENOUS | Status: DC
  Administered 2019-03-07 – 2019-03-08 (×2): via INTRAVENOUS

## 2019-03-07 NOTE — ED Notes (Signed)
Critical lab result given to EDP and Resident via messenger.

## 2019-03-07 NOTE — ED Notes (Signed)
ED TO INPATIENT HANDOFF REPORT  ED Nurse Name and Phone #:  [phone]  S Name/Age/Gender Tyler Petersen 85 y.o. male Room/Bed: 022C/022C  Code Status   Code Status: Full Code  Home/SNF/Other Home Patient oriented to: self, place, time and situation Is this baseline? Yes   Triage Complete: Triage complete  Chief Complaint LOW HGB  Triage Note No notes on file   Allergies Allergies  Allergen Reactions  . Influenza Vaccines     Level of Care/Admitting Diagnosis ED Disposition    ED Disposition Condition Comment   Admit  [facility] [[id number]]  Level of Care: Telemetry Medical [104]  I expect the patient will be discharged within 24 hours: No (not a candidate for 5C-Observation unit)  Covid Evaluation: Asymptomatic Screening Protocol (No Symptoms)  Diagnosis: Symptomatic anemia [[id number]][[id number]]  Admitting Physician: Clydie BraunSMITH, RONDELL A [[id number]]  Attending Physician: Clydie BraunSMITH, RONDELL A [[id number]]  PT Class (Do Not Modify): Observation [104]  PT Acc Code (Do Not Modify): Observation [10022]       B Medical/Surgery History Past Medical History:  Diagnosis Date  . Anxiety   . BPH (benign prostatic hyperplasia)   . HTN (hypertension)   . Hyponatremia   . Osteoarthritis    History reviewed. No pertinent surgical history.   A IV Location/Drains/Wounds Patient Lines/Drains/Airways Status   Active Line/Drains/Airways    Name:   Placement date:   Placement time:   Site:   Days:   Peripheral IV 03/07/19 Right Antecubital   03/07/19    1714    Antecubital   less than 1   Peripheral IV 03/07/19 Left Wrist   03/07/19    1730    Wrist   less than 1   External Urinary Catheter   03/07/19    1934    -   less than 1          Intake/Output Last 24 hours  Intake/Output Summary (Last 24 hours) at 03/07/2019 2119 Last data filed at 03/07/2019 1906 Gross per 24 hour  Intake 315 ml  Output -  Net 315 ml    Labs/Imaging Results for orders  placed or performed during the hospital encounter of 03/07/19 (from the past 48 hour(s))  Comprehensive metabolic panel     Status: Abnormal   Collection Time: 03/07/19  3:10 PM  Result Value Ref Range   Sodium 127 (L) 135 - 145 mmol/L   Potassium 5.0 3.5 - 5.1 mmol/L   Chloride 94 (L) 98 - 111 mmol/L   CO2 24 22 - 32 mmol/L   Glucose, Bld 157 (H) 70 - 99 mg/dL   BUN 19 8 - 23 mg/dL   Creatinine, Ser 8.650.73 0.61 - 1.24 mg/dL   Calcium 8.0 (L) 8.9 - 10.3 mg/dL   Total Protein 4.6 (L) 6.5 - 8.1 g/dL   Albumin 2.7 (L) 3.5 - 5.0 g/dL   AST 22 15 - 41 U/L   ALT 27 0 - 44 U/L   Alkaline Phosphatase 39 38 - 126 U/L   Total Bilirubin 0.5 0.3 - 1.2 mg/dL   GFR calc non Af Amer >60 >60 mL/min   GFR calc Af Amer >60 >60 mL/min   Anion gap 9 5 - 15    Comment: Performed at [facility], [address] [place], [place] [address]  CBC     Status: Abnormal   Collection Time: 03/07/19  3:10 PM  Result Value Ref Range   WBC 20.7 (H)  4.0 - 10.5 K/uL   RBC 1.86 (L) 4.22 - 5.81 MIL/uL   Hemoglobin 5.8 (LL) 13.0 - 17.0 g/dL    Comment: REPEATED TO VERIFY THIS CRITICAL RESULT HAS VERIFIED AND BEEN CALLED TO B LEAK RN BY KIRSTENE FORSYTH ON 08 03 2020 AT 1551, AND HAS BEEN READ BACK.     HCT 18.5 (L) 39.0 - 52.0 %   MCV 99.5 80.0 - 100.0 fL   MCH 31.2 26.0 - 34.0 pg   MCHC 31.4 30.0 - 36.0 g/dL   RDW 16.115.9 (H) 09.611.5 - 04.515.5 %   Platelets 291 150 - 400 K/uL   nRBC 0.0 0.0 - 0.2 %    Comment: Performed at [facility], [address] [place], [place] [address]  Type and screen [facility]     Status: None (Preliminary result)   Collection Time: 03/07/19  3:10 PM  Result Value Ref Range   ABO/RH(D) A POS    Antibody Screen NEG    Sample Expiration 03/10/2019,2359    Unit Number [id number]    Blood Component Type RED CELLS,LR    Unit division 00    Status of Unit ISSUED    Transfusion Status OK TO TRANSFUSE    Crossmatch Result      Compatible Performed at [facility], [address] [place], [place] [address]    Unit Number [id number]    Blood Component Type RED CELLS,LR    Unit division 00    Status of Unit ISSUED    Transfusion Status OK TO TRANSFUSE    Crossmatch Result Compatible   ABO/Rh     Status: None   Collection Time: 03/07/19  3:10 PM  Result Value Ref Range   ABO/RH(D)      A POS Performed at [facility], [address] [place], [place] [address]   Prepare RBC     Status: None   Collection Time: 03/07/19  3:57 PM  Result Value Ref Range   Order Confirmation      ORDER PROCESSED BY BLOOD BANK Performed at [facility], [address] [place], [place] [address]   SARS Coronavirus [facility](Hospital order, Performed in [facility] hospital lab) Nasopharyngeal Nasopharyngeal Swab     Status: None   Collection Time: 03/07/19  4:15 PM   Specimen: Nasopharyngeal Swab  Result Value Ref Range   SARS Coronavirus 2 NEGATIVE NEGATIVE    Comment: (NOTE) If result is NEGATIVE SARS-CoV-2 target nucleic acids are NOT DETECTED. The SARS-CoV-2 RNA is generally detectable in upper and lower  respiratory specimens during the acute phase of infection. The lowest  concentration of SARS-CoV-2 viral copies this assay can detect is 250  copies / mL. A negative result does not preclude SARS-CoV-2 infection  and should not be used as the sole basis for treatment or other  patient management decisions.  A negative result may occur with  improper specimen collection / handling, submission of specimen other  than nasopharyngeal swab, presence of viral mutation(s) within the  areas targeted by this assay, and inadequate number of viral copies  (<250 copies / mL). A negative result must be combined with clinical  observations, patient history, and epidemiological information. If result is POSITIVE SARS-CoV-2 target nucleic acids are DETECTED. The SARS-CoV-2 RNA is generally detectable in upper and lower  respiratory specimens  dur ing the acute phase of infection.  Positive  results are indicative of active infection with  SARS-CoV-2.  Clinical  correlation with patient history and other diagnostic information is  necessary to determine patient infection status.  Positive results do  not rule out bacterial infection or co-infection with other viruses. If result is PRESUMPTIVE POSTIVE SARS-CoV-2 nucleic acids MAY BE PRESENT.   A presumptive positive result was obtained on the submitted specimen  and confirmed on repeat testing.  While 2019 novel coronavirus  (SARS-CoV-2) nucleic acids may be present in the submitted sample  additional confirmatory testing may be necessary for epidemiological  and / or clinical management purposes  to differentiate between  SARS-CoV-2 and other Sarbecovirus currently known to infect humans.  If clinically indicated additional testing with an alternate test  methodology [phone]) is advised. The SARS-CoV-2 RNA is generally  detectable in upper and lower respiratory sp ecimens during the acute  phase of infection. The expected result is Negative. Fact Sheet for Patients:  BoilerBrush.com.cy Fact Sheet for Healthcare Providers: https://pope.com/ This test is not yet approved or cleared by the [place] FDA and has been authorized for detection and/or diagnosis of SARS-CoV-2 by FDA under an Emergency Use Authorization (EUA).  This EUA will remain in effect (meaning this test can be used) for the duration of the COVID-19 declaration under Section 564(b)(1) of the Act, 21 U.S.C. section 360bbb-3(b)(1), unless the authorization is terminated or revoked sooner. Performed at [facility], [address], [place], [place] [address]   Urinalysis, Routine w reflex microscopic     Status: None   Collection Time: 03/07/19  7:45 PM  Result Value Ref Range   Color, Urine YELLOW YELLOW   APPearance CLEAR CLEAR   Specific Gravity,  Urine 1.012 1.005 - 1.030   pH 5.0 5.0 - 8.0   Glucose, UA NEGATIVE NEGATIVE mg/dL   Hgb urine dipstick NEGATIVE NEGATIVE   Bilirubin Urine NEGATIVE NEGATIVE   Ketones, ur NEGATIVE NEGATIVE mg/dL   Protein, ur NEGATIVE NEGATIVE mg/dL   Nitrite NEGATIVE NEGATIVE   Leukocytes,Ua NEGATIVE NEGATIVE    Comment: Performed at [facility], [address], [place], [place] [address]   Dg Chest Port 1 View  Result Date: 03/07/2019 CLINICAL DATA:  GI bleed EXAM: PORTABLE CHEST 1 VIEW COMPARISON:  02/09/2019 FINDINGS: Low lung volumes. Mild airspace disease at the left base. Elevated right diaphragm with colonic inter positioning beneath the right diaphragm. Stable cardiomediastinal silhouette. No pneumothorax. IMPRESSION: 1. Low lung volumes with atelectasis or mild infiltrate at the left base. Electronically Signed   By: Jasmine Pang M.D.   On: 03/07/2019 18:23    Pending Labs Unresulted Labs (From admission, onward)    Start     Ordered   03/08/19 0500  CBC  Tomorrow morning,   R     03/07/19 1741   03/08/19 0500  Basic metabolic panel  Tomorrow morning,   R     03/07/19 1741   03/07/19 1813  Sodium, urine, random  ONCE - STAT,   STAT     03/07/19 1812   03/07/19 1813  Osmolality, urine  Once,   STAT     03/07/19 1812          Vitals/Pain Today's Vitals   03/07/19 2030 03/07/19 2045 03/07/19 2100 03/07/19 2117  BP: (!) 133/51 (!) 137/114 114/71   Pulse: 72 65 80   Resp: 18 16 (!) 28   Temp:      TempSrc:      SpO2: 100% 100% 100%   PainSc:  0-No pain    Isolation Precautions No active isolations  Medications Medications  0.9 %  sodium chloride infusion (has no administration in time range)  FLUoxetine (PROZAC) capsule 10 mg (has no administration in time range)  traZODone (DESYREL) tablet 50 mg (has no administration in time range)  amLODipine (NORVASC) tablet 10 mg (has no administration in time range)  losartan (COZAAR) tablet 50 mg (has no administration in  time range)  pantoprazole (PROTONIX) EC tablet 40 mg (has no administration in time range)  finasteride (PROSCAR) tablet 5 mg (has no administration in time range)  tamsulosin (FLOMAX) capsule 0.4 mg (has no administration in time range)  ondansetron (ZOFRAN) tablet 4 mg (has no administration in time range)    Or  ondansetron (ZOFRAN) injection 4 mg (has no administration in time range)  acetaminophen (TYLENOL) tablet 650 mg (has no administration in time range)    Or  acetaminophen (TYLENOL) suppository 650 mg (has no administration in time range)  albuterol (PROVENTIL) (2.5 MG/3ML) 0.083% nebulizer solution 2.5 mg (has no administration in time range)  predniSONE (DELTASONE) tablet 30 mg (has no administration in time range)    Followed by  predniSONE (DELTASONE) tablet 20 mg (has no administration in time range)  0.9 %  sodium chloride infusion ( Intravenous New Bag/Given 03/07/19 1912)  pantoprazole (PROTONIX) injection 40 mg (40 mg Intravenous Given 03/07/19 1912)    Mobility non-ambulatory Moderate fall risk   Focused Assessments Cardiac Assessment Handoff:    No results found for: CKTOTAL, CKMB, CKMBINDEX, TROPONINI No results found for: DDIMER Does the Patient currently have chest pain? No      R Recommendations: See Admitting Provider Note  Report given to:   Additional Notes:

## 2019-03-07 NOTE — H&P (Signed)
History and Physical    Tyler Petersen IYM:[id number] DOB: 07-22-1929 DOA: 03/07/2019  Referring MD/NP/PA: Jaclynn Major, MD PCP: Charlott Rakes, MD  Patient coming from:[facility] rehab via EMS  Chief Complaint: Dark stool  I have personally briefly reviewed patient's old medical records in [facility]   HPI: Tyler Petersen is a 85 y.o. male with medical history significant of hypertension, BPH, hyponatremia, osteoarthritis, and anxiety; who presents with complaints of dark stools over the last "couple days".  Patient had just recently been hospitalized at [facility] from 7/8-7/10 after having a fall.  Diagnosed with traumatic rhabdomyolysis with CK elevated up to 12,017, treated for urinary tract infection, and evaluated by cardiology to have a type II MI with troponin peaking at 1.69.  Patient was with IV fluids and heparin drip.  MRI of his brain was obtained and noted to be negative during his hospitalization for reports of slurred speech.   Patient reports that he has a speech impediment.  Cardiology evaluated the patient.  Echocardiogram showed EF of 55 to 60% with diastolic filling pattern is consistent with impaired relaxation.  Heparin drip was discontinued and he was recommended to continue daily aspirin.  He had been living at home alone prior to that hospitalization, and ultimately was sent to [facility] nursing facility for rehabilitation.    Patient is not on any blood thinners and takes a daily aspirin.  He reports that at rehab his blood pressures have been low recently and his oxygenation has been fluctuating.  He states that stools have been dark color, but cannot quantify exactly how long.  Denies having any abdominal pain, nausea, vomiting, or dysuria symptoms.  His last colonoscopy was performed in [place], [place] approximately 8 years ago.  It was negative for any acute abnormalities to his knowledge.  ED Course: Upon admission into the patient was  noted to be afebrile, 72-84, blood pressure 106/45-122/57, respirations to 22, and O2 saturations maintained on 4 L nasal cannula oxygen.  Labs revealed BC 20.7, hemoglobin 5.8, platelets 291, sodium 127, potassium 5, BUN 19, creatinine 0.73, and glucose 157.  Stool guaiacs were reported to be positive.  Patient was typed and screened possible need of blood products and ordered to be transfused 2 units of packed red blood cells.  [facility] was consulted and [facility] called to admit.  Review of Systems  Constitutional: Positive for malaise/fatigue. Negative for chills.  HENT: Negative for ear discharge and nosebleeds.   Eyes: Negative for photophobia and pain.  Respiratory: Negative for sputum production.   Cardiovascular: Negative for chest pain and leg swelling.  Gastrointestinal: Positive for melena. Negative for abdominal pain.  Genitourinary: Negative for dysuria and frequency.  Musculoskeletal: Negative for falls.  Neurological: Positive for weakness. Negative for focal weakness, loss of consciousness and headaches.  Psychiatric/Behavioral: Negative for substance abuse.    Past Medical History:  Diagnosis Date  . Anxiety   . BPH (benign prostatic hyperplasia)   . HTN (hypertension)   . Osteoarthritis     History reviewed. No pertinent surgical history.   reports that he has never smoked. He has never used smokeless tobacco. He reports that he does not use drugs. No history on file for alcohol.  Allergies  Allergen Reactions  . Influenza Vaccines     Family history significant for hypertension  Prior to Admission medications   Medication Sig Start Date End Date Taking? Authorizing Provider  acetaminophen (TYLENOL) 325 MG tablet Take 650 mg by mouth  every 6 (six) hours as needed.   Yes [provider]  amLODipine (NORVASC) 10 MG tablet Take 10 mg by mouth every Monday, Wednesday, and Friday.  01/16/19  Yes [provider]  bisacodyl (DULCOLAX) 10 MG suppository  Place 10 mg rectally as needed for mild constipation, moderate constipation or severe constipation.   Yes [provider]  finasteride (PROSCAR) 5 MG tablet Take 5 mg by mouth daily.   Yes [provider]  FLUoxetine (PROZAC) 10 MG capsule Take 10 mg by mouth daily.   Yes [provider]  losartan (COZAAR) 50 MG tablet Take 50 mg by mouth daily.   Yes [provider]  magnesium hydroxide (MILK OF MAGNESIA) 400 MG/5ML suspension Take 30 mLs by mouth daily as needed for mild constipation or moderate constipation.   Yes [provider]  OXYGEN Inhale 2 L into the lungs continuous.   Yes [provider]  pantoprazole (PROTONIX) 40 MG tablet Take 40 mg by mouth 2 (two) times daily.   Yes [provider]  predniSONE (DELTASONE) 10 MG tablet Take 20-30 mg by mouth See admin instructions. Take 30 mg daily for 5 days for OA starting 03/05/19   Then take 20 mg daily for OA Starting 03/09/19   Yes [provider]  tamsulosin (FLOMAX) 0.4 MG CAPS capsule Take 0.4 mg by mouth.   Yes [provider]  traZODone (DESYREL) 50 MG tablet Take 50 mg by mouth at bedtime. 02/10/19  Yes [provider]    Physical Exam:  Constitutional: Elderly male in NAD, calm, comfortable Vitals:   03/07/19 1630 03/07/19 1700 03/07/19 1711 03/07/19 1726  BP: (!) 111/57 (!) 109/93 (!) 111/57 (!) 109/54  Pulse: 78 74 74 75  Resp: (!) 21 13 (!) 22 14  Temp:   97.7 F (36.5 C) 97.8 F (36.6 C)  TempSrc:   Oral Oral  SpO2: 100% 100% 100% 100%   Eyes: PERRL, lids and conjunctivae normal ENMT: Mucous membranes are dry. Posterior pharynx clear of any exudate or lesions.   Neck: normal, supple, no masses, no thyromegaly Respiratory: clear to auscultation bilaterally, no wheezing, no crackles. Normal respiratory effort.  Currently on 4 L of nasal cannula oxygen without significant wheezing appreciated. Cardiovascular: Regular rate and rhythm, no  murmurs / rubs / gallops. No extremity edema. 2+ pedal pulses. No carotid bruits.  Abdomen: no tenderness, no masses palpated. No hepatosplenomegaly. Bowel sounds positive.  Musculoskeletal: no clubbing / cyanosis. No joint deformity upper and lower extremities. Good ROM, no contractures. Normal muscle tone.  Skin: Bruising of the left arm. Neurologic: CN 2-12 grossly i ntact. Sensation intact, DTR normal. Strength 5/5 in all 4.  Speech is slurred but this appears more so chronic. Psychiatric: Normal judgment and insight. Alert and oriented x 3. Normal mood.     Labs on Admission: I have personally reviewed following labs and imaging studies  CBC: Recent Labs  Lab 03/07/19 1510  WBC 20.7*  HGB 5.8*  HCT 18.5*  MCV 99.5  PLT 291   Basic Metabolic Panel: Recent Labs  Lab 03/07/19 1510  NA 127*  K 5.0  CL 94*  CO2 24  GLUCOSE 157*  BUN 19  CREATININE 0.73  CALCIUM 8.0*   GFR: CrCl cannot be calculated (Unknown ideal weight.). Liver Function Tests: Recent Labs  Lab 03/07/19 1510  AST 22  ALT 27  ALKPHOS 39  BILITOT 0.5  PROT 4.6*  ALBUMIN 2.7*   No results for  input(s): LIPASE, AMYLASE in the last 168 hours. No results for input(s): AMMONIA in the last 168 hours. Coagulation Profile: No results for input(s): INR, PROTIME in the last 168 hours. Cardiac Enzymes: No results for input(s): CKTOTAL, CKMB, CKMBINDEX, TROPONINI in the last 168 hours. BNP (last 3 results) No results for input(s): PROBNP in the last 8760 hours. HbA1C: No results for input(s): HGBA1C in the last 72 hours. CBG: No results for input(s): GLUCAP in the last 168 hours. Lipid Profile: No results for input(s): CHOL, HDL, LDLCALC, TRIG, CHOLHDL, LDLDIRECT in the last 72 hours. Thyroid Function Tests: No results for input(s): TSH, T4TOTAL, FREET4, T3FREE, THYROIDAB in the last 72 hours. Anemia Panel: No results for input(s): VITAMINB12, FOLATE, FERRITIN, TIBC, IRON, RETICCTPCT in the last 72  hours. Urine analysis: No results found for: COLORURINE, APPEARANCEUR, LABSPEC, PHURINE, GLUCOSEU, HGBUR, BILIRUBINUR, KETONESUR, PROTEINUR, UROBILINOGEN, NITRITE, LEUKOCYTESUR Sepsis Labs: No results found for this or any previous visit (from the past 240 hour(s)).   Radiological Exams on Admission: No results found.  EKG: Independently reviewed.  Sinus rhythm at 77 bpm  Assessment/Plan GI bleed, symptomatic anemia: Patient reports having been on for last few days.  Found to have hemoglobin dropped from 10 g/dL on 7/20 down to 5.8 g/dL today today.  Stool guaiacs reported to be positive.  Denies having any significant abdominal pain.  Last colonoscopy noted to be 8 years ago in [place] that was reported to be negative for any acute abnormalities.  Question cause of GI bleed. -Admit to a telemetry bed -Clear liquid diet, n.p.o. after midnight -Continue with transfusion of 2 units of packed red blood cells -Continue to monitor hemoglobin and  transfuse blood products as needed -Continue Protonix 40 mg twice daily. -Hold aspirin -Appreciate  GI, we will follow-up for further recommendations  Leukocytosis: Suspect chronic. WBC elevated 20.7 on admission.  This does not appear to be new as records show WBC elevated up to 27, but treated for urinary tract infection during admission at [facility] in July.  WBC was noted to decreased down to 18 prior to discharge. -Check urinalysis and treat if signs of infection -Recheck CBC in a.m. -May warrant further investigation  Hyponatremia: Acute on chronic.  Sodium 127 on admission.  Baseline sodium appears to be around 133.  During previous hospitalization at [facility].  Sodium levels improved with gentle IV fluids.  On the differential includes hyponatremia related to Prozac. -Check urine osmolarity and urine sodium -Normal saline at 75 mL/h -Recheck sodium levels in a.m.  Respiratory failure with hypoxia: Patient reports being on  oxygen 2 L nasal cannula 24/7 since being discharged from [facility]. -Continuous pulse oximetry with 2 L nasal cannula oxygen -Check chest x-ray  Osteoarthritis: Patient on prednisone. -Continue prednisone  Steroid-induced hyperglycemia: Blood glucose initially mildly elevated at 157.  Last hemoglobin A1c 5.6 in 02/2019. -Continue to monitor  Anxiety: Patient has history of anxiety and has been on Prozac for quite some time. -Continue Prozac  Osteoarthritis: Patient on regimen of steroids currently. -Continue steroid regimen  BPH -Continue Proscar and Flomax  DVT prophylaxis: SCD Code Status: Full Family Communication: No family present Disposition Plan: Likely discharge home in 1 to 2 days. Consults called: Gastroenterology Admission status: observation  Norval Morton MD [facility] Pager [phone]   If 7PM-7AM, please contact night-coverage [email or web address] Password [email or web address]  03/07/2019, 5:35 PM

## 2019-03-07 NOTE — ED Provider Notes (Signed)
I saw and evaluated the patient, reviewed the resident's note and I agree with the findings and plan.  EKG: EKG Interpretation  Date/Time:  Monday March 07 2019 14:51:28 EDT Ventricular Rate:  77 PR Interval:    QRS Duration: 105 QT Interval:  389 QTC Calculation: 441 R Axis:   -31 Text Interpretation:  Sinus rhythm Left axis deviation Borderline low voltage, extremity leads Abnormal R-wave progression, early transition Confirmed by Lacretia Leigh ([id number]) on 03/07/2019 4:23:91 PM  85 year old male presents with weakness.  Patient blood work done and showed low hemoglobin.  He also had guaiac positive stools.  Will admit to the hospitalist service   Lacretia Leigh, MD 03/07/19 [phone]

## 2019-03-07 NOTE — ED Provider Notes (Signed)
[facility] EMERGENCY DEPARTMENT Provider Note   CSN: [id number] Arrival date & time: 03/07/19  1438    History   Chief Complaint Chief Complaint  Patient presents with  . Abnormal Lab     low hgb  . GI Bleeding    HPI Tyler Petersen is a 85 y.o. male.     HPI Patient presents from [facility] for evaluation of dark stool Hemoccult positive and anemia.  Patient is a poor historian and unable to give additional details.  He states that in general he is feeling well.  I have reviewed his record and in early July he had a fall and was down fairly several hours at home.  Was evaluated at [facility] where he was found to have rhabdomyolysis and NSTEMI.  Treated there with heparin.  Subsequently placed for rehab, from which he was directed to present here.  No past medical history on file.  There are no active problems to display for this patient.         Home Medications    Prior to Admission medications   Medication Sig Start Date End Date Taking? Authorizing Provider  acetaminophen (TYLENOL) 325 MG tablet Take 650 mg by mouth every 6 (six) hours as needed.   Yes [provider]  amLODipine (NORVASC) 10 MG tablet Take 10 mg by mouth every Monday, Wednesday, and Friday.  01/16/19  Yes [provider]  bisacodyl (DULCOLAX) 10 MG suppository Place 10 mg rectally as needed for mild constipation, moderate constipation or severe constipation.   Yes [provider]  finasteride (PROSCAR) 5 MG tablet Take 5 mg by mouth daily.   Yes [provider]  FLUoxetine (PROZAC) 10 MG capsule Take 10 mg by mouth daily.   Yes [provider]  losartan (COZAAR) 50 MG tablet Take 50 mg by mouth daily.   Yes [provider]  magnesium hydroxide (MILK OF MAGNESIA) 400 MG/5ML suspension Take 30 mLs by mouth daily as needed for mild constipation or moderate constipation.   Yes [provider]  OXYGEN  Inhale 2 L into the lungs continuous.   Yes [provider]  pantoprazole (PROTONIX) 40 MG tablet Take 40 mg by mouth 2 (two) times daily.   Yes [provider]  predniSONE (DELTASONE) 10 MG tablet Take 20-30 mg by mouth See admin instructions. Take 30 mg daily for 5 days for OA starting 03/05/19   Then take 20 mg daily for OA Starting 03/09/19   Yes [provider]  tamsulosin (FLOMAX) 0.4 MG CAPS capsule Take 0.4 mg by mouth.   Yes [provider]  traZODone (DESYREL) 50 MG tablet Take 50 mg by mouth at bedtime. 02/10/19  Yes [provider]    Family History No family history on file.  Social History Social History   Tobacco Use  . Smoking status: Not on file  Substance Use Topics  . Alcohol use: Not on file  . Drug use: Not on file     Allergies   Influenza vaccines   Review of Systems Review of Systems  Genitourinary: Positive for difficulty urinating.  Hematological: Bruises/bleeds easily.  All other systems reviewed and are negative.    Physical Exam Updated Vital Signs BP (!) 109/93   Pulse 74   Resp 13   SpO2 100%   Physical Exam Vitals signs and nursing note reviewed.  Constitutional:      Appearance: He is well-developed.  HENT:  Head: Normocephalic and atraumatic.  Eyes:     Conjunctiva/sclera: Conjunctivae normal.  Neck:     Musculoskeletal: Neck supple.  Cardiovascular:     Rate and Rhythm: Normal rate and regular rhythm.     Heart sounds: No murmur.  Pulmonary:     Effort: Pulmonary effort is normal. No respiratory distress.     Breath sounds: Normal breath sounds.  Abdominal:     Palpations: Abdomen is soft.     Tenderness: There is no abdominal tenderness.  Skin:    General: Skin is warm and dry.     Coloration: Skin is pale.  Neurological:     Mental Status: He is alert.  Psychiatric:        Mood and Affect: Mood normal.      ED Treatments / Results  Labs (all labs ordered are  listed, but only abnormal results are displayed) Labs Reviewed  COMPREHENSIVE METABOLIC PANEL - Abnormal; Notable for the following components:      Result Value   Sodium 127 (*)    Chloride 94 (*)    Glucose, Bld 157 (*)    Calcium 8.0 (*)    Total Protein 4.6 (*)    Albumin 2.7 (*)    All other components within normal limits  CBC - Abnormal; Notable for the following components:   WBC 20.7 (*)    RBC 1.86 (*)    Hemoglobin 5.8 (*)    HCT 18.5 (*)    RDW 15.9 (*)    All other components within normal limits  SARS CORONAVIRUS 2 (HOSPITAL ORDER, PERFORMED IN [facility])  POC OCCULT BLOOD, ED  POC OCCULT BLOOD, ED  TYPE AND SCREEN  ABO/RH  PREPARE RBC (CROSSMATCH)    EKG EKG Interpretation  Date/Time:  Monday March 07 2019 14:51:28 EDT Ventricular Rate:  77 PR Interval:    QRS Duration: 105 QT Interval:  389 QTC Calculation: 441 R Axis:   -31 Text Interpretation:  Sinus rhythm Left axis deviation Borderline low voltage, extremity leads Abnormal R-wave progression, early transition Confirmed by Lorre NickAllen, Tyler Petersen ([id number]) on 03/07/2019 4:08:15 PM   Radiology No results found.  Procedures Procedures (including critical care time)  Medications Ordered in ED Medications  0.9 %  sodium chloride infusion (has no administration in time range)     Initial Impression / Assessment and Plan / ED Course  I have reviewed the triage vital signs and the nursing notes.  Pertinent labs & imaging results that were available during my care of the patient were reviewed by me and considered in my medical decision making (see chart for details).       Tyler Petersen is an 85 year old male with a history of  NSTEMI, Ramiah lysis, essential hypertension, low white blood cell counts with unspecified cause, long-term and current use of aspirin, BPH, MDD, GERD without known esophagitis, arthritis.  I have reviewed his medical records.  I do not see that he has any known GI  complaints other than GERD.  At referring facility he had reported dark stool that was guaiac positive.  Here his hemoglobin is 5.8.  2 units of PRBCs ordered for transfusion.  He remained hemodynamically stable.  Subsequently admitted to the hospitalist service for presumed GI bleed.  Spoke with Dr. Clarita LeberGribbin from GI who will follow and evaluate in person in the morning.  He has had downtrending hemoglobin for last several weeks according to paperwork from facility.  I do not think that this  is an acute bleed that requires emergent endoscopy.  He is hemodynamically stable and appropriate for floor.    Final Clinical Impressions(s) / ED Diagnoses   Final diagnoses:  Anemia, unspecified type  Gastrointestinal hemorrhage, unspecified gastrointestinal hemorrhage type    ED Discharge Orders    None       Tillie Fantasia, MD 03/07/19 1710    Lacretia Leigh, MD 03/11/19 [phone]

## 2019-03-08 ENCOUNTER — Other Ambulatory Visit: Payer: Self-pay

## 2019-03-08 ENCOUNTER — Observation Stay (HOSPITAL_COMMUNITY): Payer: Medicare Other | Admitting: Anesthesiology

## 2019-03-08 ENCOUNTER — Encounter (HOSPITAL_COMMUNITY)
Admission: EM | Disposition: A | Discharge status: Skilled Nursing Facility | Payer: Self-pay | Source: Home / Self Care | Attending: Internal Medicine

## 2019-03-08 ENCOUNTER — Encounter (HOSPITAL_COMMUNITY): Payer: Self-pay | Admitting: Physician Assistant

## 2019-03-08 DIAGNOSIS — D649 Anemia, unspecified: Secondary | ICD-10-CM | POA: Diagnosis not present

## 2019-03-08 DIAGNOSIS — Z20828 Contact with and (suspected) exposure to other viral communicable diseases: Secondary | ICD-10-CM | POA: Diagnosis present

## 2019-03-08 DIAGNOSIS — M1991 Primary osteoarthritis, unspecified site: Secondary | ICD-10-CM | POA: Diagnosis not present

## 2019-03-08 DIAGNOSIS — R739 Hyperglycemia, unspecified: Secondary | ICD-10-CM | POA: Diagnosis present

## 2019-03-08 DIAGNOSIS — R21 Rash and other nonspecific skin eruption: Secondary | ICD-10-CM | POA: Diagnosis present

## 2019-03-08 DIAGNOSIS — M1990 Unspecified osteoarthritis, unspecified site: Secondary | ICD-10-CM | POA: Diagnosis present

## 2019-03-08 DIAGNOSIS — N390 Urinary tract infection, site not specified: Secondary | ICD-10-CM | POA: Diagnosis present

## 2019-03-08 DIAGNOSIS — J9811 Atelectasis: Secondary | ICD-10-CM | POA: Diagnosis present

## 2019-03-08 DIAGNOSIS — K3189 Other diseases of stomach and duodenum: Secondary | ICD-10-CM | POA: Diagnosis not present

## 2019-03-08 DIAGNOSIS — R279 Unspecified lack of coordination: Secondary | ICD-10-CM | POA: Diagnosis not present

## 2019-03-08 DIAGNOSIS — I21A1 Myocardial infarction type 2: Secondary | ICD-10-CM | POA: Diagnosis present

## 2019-03-08 DIAGNOSIS — R195 Other fecal abnormalities: Secondary | ICD-10-CM | POA: Diagnosis not present

## 2019-03-08 DIAGNOSIS — D62 Acute posthemorrhagic anemia: Secondary | ICD-10-CM | POA: Insufficient documentation

## 2019-03-08 DIAGNOSIS — D72829 Elevated white blood cell count, unspecified: Secondary | ICD-10-CM | POA: Diagnosis not present

## 2019-03-08 DIAGNOSIS — Z9981 Dependence on supplemental oxygen: Secondary | ICD-10-CM | POA: Diagnosis not present

## 2019-03-08 DIAGNOSIS — F419 Anxiety disorder, unspecified: Secondary | ICD-10-CM | POA: Diagnosis present

## 2019-03-08 DIAGNOSIS — I214 Non-ST elevation (NSTEMI) myocardial infarction: Secondary | ICD-10-CM | POA: Insufficient documentation

## 2019-03-08 DIAGNOSIS — Z79899 Other long term (current) drug therapy: Secondary | ICD-10-CM | POA: Diagnosis not present

## 2019-03-08 DIAGNOSIS — J9600 Acute respiratory failure, unspecified whether with hypoxia or hypercapnia: Secondary | ICD-10-CM | POA: Diagnosis not present

## 2019-03-08 DIAGNOSIS — K2971 Gastritis, unspecified, with bleeding: Secondary | ICD-10-CM | POA: Diagnosis not present

## 2019-03-08 DIAGNOSIS — R58 Hemorrhage, not elsewhere classified: Secondary | ICD-10-CM | POA: Diagnosis not present

## 2019-03-08 DIAGNOSIS — N400 Enlarged prostate without lower urinary tract symptoms: Secondary | ICD-10-CM | POA: Diagnosis present

## 2019-03-08 DIAGNOSIS — T380X5A Adverse effect of glucocorticoids and synthetic analogues, initial encounter: Secondary | ICD-10-CM | POA: Diagnosis present

## 2019-03-08 DIAGNOSIS — T796XXA Traumatic ischemia of muscle, initial encounter: Secondary | ICD-10-CM | POA: Diagnosis present

## 2019-03-08 DIAGNOSIS — I10 Essential (primary) hypertension: Secondary | ICD-10-CM | POA: Diagnosis present

## 2019-03-08 DIAGNOSIS — K922 Gastrointestinal hemorrhage, unspecified: Secondary | ICD-10-CM | POA: Diagnosis not present

## 2019-03-08 DIAGNOSIS — K5730 Diverticulosis of large intestine without perforation or abscess without bleeding: Secondary | ICD-10-CM | POA: Diagnosis present

## 2019-03-08 DIAGNOSIS — K5790 Diverticulosis of intestine, part unspecified, without perforation or abscess without bleeding: Secondary | ICD-10-CM | POA: Diagnosis not present

## 2019-03-08 DIAGNOSIS — K921 Melena: Secondary | ICD-10-CM | POA: Diagnosis present

## 2019-03-08 DIAGNOSIS — R4182 Altered mental status, unspecified: Secondary | ICD-10-CM | POA: Diagnosis not present

## 2019-03-08 DIAGNOSIS — J9691 Respiratory failure, unspecified with hypoxia: Secondary | ICD-10-CM | POA: Diagnosis present

## 2019-03-08 DIAGNOSIS — E871 Hypo-osmolality and hyponatremia: Secondary | ICD-10-CM | POA: Diagnosis present

## 2019-03-08 DIAGNOSIS — Z7952 Long term (current) use of systemic steroids: Secondary | ICD-10-CM | POA: Diagnosis not present

## 2019-03-08 DIAGNOSIS — K648 Other hemorrhoids: Secondary | ICD-10-CM | POA: Diagnosis present

## 2019-03-08 DIAGNOSIS — Z743 Need for continuous supervision: Secondary | ICD-10-CM | POA: Diagnosis not present

## 2019-03-08 HISTORY — PX: ESOPHAGOGASTRODUODENOSCOPY (EGD) WITH PROPOFOL: SHX5813

## 2019-03-08 LAB — TYPE AND SCREEN
ABO/RH(D): A POS
Antibody Screen: NEGATIVE
Unit division: 0
Unit division: 0

## 2019-03-08 LAB — MRSA PCR SCREENING: MRSA by PCR: NEGATIVE

## 2019-03-08 LAB — BPAM RBC
Blood Product Expiration Date: 202008242359
Blood Product Expiration Date: 202008242359
ISSUE DATE / TIME: 202008031628
ISSUE DATE / TIME: 202008031932
Unit Type and Rh: 6200
Unit Type and Rh: 6200

## 2019-03-08 LAB — BASIC METABOLIC PANEL
Anion gap: 6 (ref 5–15)
BUN: 11 mg/dL (ref 8–23)
CO2: 27 mmol/L (ref 22–32)
Calcium: 8 mg/dL — ABNORMAL LOW (ref 8.9–10.3)
Chloride: 99 mmol/L (ref 98–111)
Creatinine, Ser: 0.56 mg/dL — ABNORMAL LOW (ref 0.61–1.24)
GFR calc Af Amer: >60 mL/min (ref >60)
GFR calc non Af Amer: >60 mL/min (ref >60)
Glucose, Bld: 99 mg/dL (ref 70–99)
Potassium: 4.7 mmol/L (ref 3.5–5.1)
Sodium: 132 mmol/L — ABNORMAL LOW (ref 135–145)

## 2019-03-08 SURGERY — ESOPHAGOGASTRODUODENOSCOPY (EGD) WITH PROPOFOL
Anesthesia: Monitor Anesthesia Care

## 2019-03-08 MED ORDER — PEG-KCL-NACL-NASULF-NA ASC-C 100 G PO SOLR
0.5000 | Freq: Once | ORAL | Status: AC
  Administered 2019-03-09: 100 g via ORAL
  Filled 2019-03-08: qty 1

## 2019-03-08 MED ORDER — LIDOCAINE 2% (20 MG/ML) 5 ML SYRINGE
INTRAMUSCULAR | Status: DC | PRN
  Administered 2019-03-08: 50 mg via INTRAVENOUS

## 2019-03-08 MED ORDER — PEG-KCL-NACL-NASULF-NA ASC-C 100 G PO SOLR: 1.0000 | Freq: Once | ORAL | Status: DC

## 2019-03-08 MED ORDER — PEG-KCL-NACL-NASULF-NA ASC-C 100 G PO SOLR
0.5000 | Freq: Once | ORAL | Status: AC
  Administered 2019-03-08: 100 g via ORAL
  Filled 2019-03-08: qty 1

## 2019-03-08 MED ORDER — SODIUM CHLORIDE 0.9 % IV SOLN
INTRAVENOUS | Status: DC | PRN
  Administered 2019-03-08: 13:00:00 via INTRAVENOUS

## 2019-03-08 MED ORDER — PHENYLEPHRINE 40 MCG/ML (10ML) SYRINGE FOR IV PUSH (FOR BLOOD PRESSURE SUPPORT)
PREFILLED_SYRINGE | INTRAVENOUS | Status: DC | PRN
  Administered 2019-03-08 (×2): 80 ug via INTRAVENOUS

## 2019-03-08 MED ORDER — SODIUM CHLORIDE 0.9 % IV SOLN
INTRAVENOUS | Status: DC
  Administered 2019-03-09: 15:00:00 via INTRAVENOUS

## 2019-03-08 MED ORDER — PROPOFOL 10 MG/ML IV BOLUS
INTRAVENOUS | Status: DC | PRN
  Administered 2019-03-08: 60 mg via INTRAVENOUS
  Administered 2019-03-08: 30 mg via INTRAVENOUS
  Administered 2019-03-08: 50 mg via INTRAVENOUS

## 2019-03-08 SURGICAL SUPPLY — 14 items

## 2019-03-08 NOTE — Op Note (Signed)
[facility] Patient Name: Tyler Petersen Procedure Date : 03/08/2019 MRN: [id number] Attending MD: Jerene Bears , MD Date of Birth: Dec 24, 1928 CSN: [id number] Age: 85 Admit Type: Inpatient Procedure:                Upper GI endoscopy Indications:              Acute post hemorrhagic anemia, Heme positive stool Providers:                Lajuan Lines. Hilarie Fredrickson, MD, Ashley Jacobs, RN, Cherylynn Ridges, Technician Referring MD:             [facility] Medicines:                Monitored Anesthesia Care Complications:            No immediate complications. Estimated Blood Loss:     Estimated blood loss: none. Procedure:                Pre-Anesthesia Assessment:                           - Prior to the procedure, a History and Physical                            was performed, and patient medications and                            allergies were reviewed. The patient's tolerance of                            previous anesthesia was also reviewed. The risks                            and benefits of the procedure and the sedation                            options and risks were discussed with the patient.                            All questions were answered, and informed consent                            was obtained. Prior Anticoagulants: The patient has                            taken no previous anticoagulant or antiplatelet                            agents. ASA Grade Assessment: III - A patient with                            severe systemic disease. After reviewing the risks  and benefits, the patient was deemed in                            satisfactory condition to undergo the procedure.                           After obtaining informed consent, the endoscope was                            passed under direct vision. Throughout the                            procedure, the patient's blood pressure, pulse, and                            oxygen saturations were monitored continuously. The                            [id number]-[id number] ([id number] Olympus gastroscope was                            introduced through the mouth, and advanced to the                            third part of duodenum. The upper GI endoscopy was                            accomplished without difficulty. The patient                            tolerated the procedure well. Scope In: Scope Out: Findings:      The examined esophagus was normal.      Diffuse mildly erythematous mucosa without bleeding was found in the       entire examined stomach.      The examined duodenum was normal. Impression:               - Normal esophagus.                           - Erythematous mucosa in the stomach.                           - Normal examined duodenum.                           - No specimens collected. Moderate Sedation:      N/A Recommendation:           - Return patient to hospital ward for ongoing care.                           - Clear liquid diet.                           - Continue present medications.                           -  Discuss possible colonoscopy to further evaluate                            dark heme positive stools and anemia. Procedure Code(s):        --- Professional ---                           [phone], Esophagogastroduodenoscopy, flexible,                            transoral; diagnostic, including collection of                            specimen(s) by brushing or washing, when performed                            (separate procedure) Diagnosis Code(s):        --- Professional ---                           K31.89, Other diseases of stomach and duodenum                           D62, Acute posthemorrhagic anemia                           R19.5, Other fecal abnormalities CPT copyright 2019 American Medical Association. All rights reserved. The codes documented in this report are preliminary and upon coder review  may  be revised to meet current compliance requirements. Beverley Fiedler, MD 03/08/2019 1:35:51 PM This report has been signed electronically. Number of Addenda: 0

## 2019-03-08 NOTE — Anesthesia Procedure Notes (Signed)
Procedure Name: MAC Date/Time: 03/08/2019 1:11 PM Performed by: Orlie Dakin, CRNA Pre-anesthesia Checklist: Patient identified, Emergency Drugs available, Suction available and Patient being monitored Patient Re-evaluated:Patient Re-evaluated prior to induction Oxygen Delivery Method: Nasal cannula Preoxygenation: Pre-oxygenation with 100% oxygen Induction Type: IV induction

## 2019-03-08 NOTE — Anesthesia Preprocedure Evaluation (Signed)
Anesthesia Evaluation  Patient identified by MRN, date of birth, ID band Patient awake    Reviewed: Allergy & Precautions, NPO status , Patient's Chart, lab work & pertinent test results  Airway Mallampati: II  TM Distance: >3 FB Neck ROM: Full    Dental no notable dental hx.    Pulmonary neg pulmonary ROS,    Pulmonary exam normal breath sounds clear to auscultation       Cardiovascular hypertension, Pt. on medications + Past MI  Normal cardiovascular exam Rhythm:Regular Rate:Normal     Neuro/Psych negative neurological ROS  negative psych ROS   GI/Hepatic negative GI ROS, Neg liver ROS,   Endo/Other  negative endocrine ROS  Renal/GU negative Renal ROS  negative genitourinary   Musculoskeletal negative musculoskeletal ROS (+)   Abdominal   Peds negative pediatric ROS (+)  Hematology  (+) anemia ,   Anesthesia Other Findings   Reproductive/Obstetrics negative OB ROS                             Anesthesia Physical Anesthesia Plan  ASA: III  Anesthesia Plan: MAC   Post-op Pain Management:    Induction: Intravenous  PONV Risk Score and Plan: 1 and Treatment may vary due to age or medical condition  Airway Management Planned: Nasal Cannula and Simple Face Mask  Additional Equipment:   Intra-op Plan:   Post-operative Plan:   Informed Consent: I have reviewed the patients History and Physical, chart, labs and discussed the procedure including the risks, benefits and alternatives for the proposed anesthesia with the patient or authorized representative who has indicated his/her understanding and acceptance.     Dental advisory given  Plan Discussed with: CRNA  Anesthesia Plan Comments:         Anesthesia Quick Evaluation

## 2019-03-08 NOTE — Progress Notes (Signed)
[facility] PROGRESS NOTE  Tyler Petersen TIW:[id number] DOB: 1929/07/28 DOA: 03/07/2019 PCP: Tyler Shivers, MD  Assessment/Plan: GI bleed/ symptomatic anemia: etiology unclear. Was started on steroids several days ago. S/P 2 units PRBC's. Hg. 8.6. evaluated by GI who recommend EGD today.  -Continue to monitor hemoglobin and  transfuse blood products as needed -Continue Protonix 40 mg twice daily. -Hold aspirin -monitor Hg -Appreciate  GI, we will follow-up for further recommendations  Leukocytosis: Suspect chronic. WBC elevated 20.7 on admission and 17.1 this am.   This does not appear to be new as records show WBC elevated up to 27, but treated for urinary tract infection during admission at [facility] in July.  WBC was noted to decreased down to 18 prior to discharge. Urinalysis unremarkable. Chest xray with low lung volumes with atelectasis or mild infiltrate at left base. He is afebrile and non-toxic appearing -monitor -Recheck CBC in a.m  Hyponatremia: Acute on chronic.  Sodium 127 on admission and 132 today.  Baseline sodium appears to be around 133.  During previous hospitalization at [facility].   -Normal saline at 75 mL/h -Recheck sodium levels in a.m.  Respiratory failure with hypoxia: Patient reports being on oxygen 2 L nasal cannula 24/7 since being discharged from [facility]. Oxygen saturation level greater 90% on 2L.  -Continuous pulse oximetry with 2 L nasal cannula oxygen -incentive spirometry  Osteoarthritis: Patient on prednisone. -Continue prednisone  Steroid-induced hyperglycemia: Blood glucose initially mildly elevated at 157. This am serum glucose 99. Last hemoglobin A1c 5.6 in 02/2019. -Continue to monitor  Anxiety: Patient has history of anxiety and has been on Prozac for quite some time. -Continue Prozac  Osteoarthritis: Patient on regimen of steroids currently. -Continue steroid regimen  BPH -Continue Proscar and  Flomax    Code Status: full Family Communication: son Tyler Petersen on phone updated Disposition Plan: back to facility   Consultants:  Tyler Petersen [facility]  Procedures:  EGD  Antibiotics:    HPI/Subjective: Awake alert. Denies pain/discomfort.   85 yo admitted with symptomatic anemia/gi bleed. FOBT positive, melena. S/p 2 units PRBc's. EGD today.   Objective: Vitals:   03/08/19 0028 03/08/19 0536  BP: (!) 105/43 (!) 134/46  Pulse: 67 67  Resp: 17 16  Temp: (!) 97.5 F (36.4 C) (!) 97.5 F (36.4 C)  SpO2: 100% 100%    Intake/Output Summary (Last 24 hours) at 03/08/2019 1032 Last data filed at 03/08/2019 0928 Gross per 24 hour  Intake 1348.77 ml  Output 350 ml  Net 998.77 ml   Filed Weights   03/07/19 2220 03/08/19 0533  Weight: 78.5 kg 81.6 kg    Exam:   General:  Awake alert slightly pale. Pale mucus membranes. No acute distress  Cardiovascular: RRR  No mgr no LE edema  Respiratory: normal effort BS clear bilaterally no wheeze  Abdomen: non-distended non-tender +BS no guarding or rebounding  Musculoskeletal: joints without swelling/erythema   Data Reviewed: Basic Metabolic Panel: Recent Labs  Lab 03/07/19 1510 03/08/19 0042  NA 127* 132*  K 5.0 4.7  CL 94* 99  CO2 24 27  GLUCOSE 157* 99  BUN 19 11  CREATININE 0.73 0.56*  CALCIUM 8.0* 8.0*   Liver Function Tests: Recent Labs  Lab 03/07/19 1510  AST 22  ALT 27  ALKPHOS 39  BILITOT 0.5  PROT 4.6*  ALBUMIN 2.7*   No results for input(s): LIPASE, AMYLASE in the last 168 hours. No results for input(s): AMMONIA in the last 168 hours. CBC:  Recent Labs  Lab 03/07/19 1510 03/08/19 0042  WBC 20.7* 17.1*  HGB 5.8* 8.6*  HCT 18.5* 25.4*  MCV 99.5 93.0  PLT 291 202   Cardiac Enzymes: No results for input(s): CKTOTAL, CKMB, CKMBINDEX, TROPONINI in the last 168 hours. BNP (last 3 results) No results for input(s): BNP in the last 8760 hours.  ProBNP (last 3 results) No results for  input(s): PROBNP in the last 8760 hours.  CBG: No results for input(s): GLUCAP in the last 168 hours.  Recent Results (from the past 240 hour(s))  SARS Coronavirus 2 [facility] order, Performed in [facility] hospital lab) Nasopharyngeal Nasopharyngeal Swab     Status: None   Collection Time: 03/07/19  4:15 PM   Specimen: Nasopharyngeal Swab  Result Value Ref Range Status   SARS Coronavirus 2 NEGATIVE NEGATIVE Final    Comment: (NOTE) If result is NEGATIVE SARS-CoV-2 target nucleic acids are NOT DETECTED. The SARS-CoV-2 RNA is generally detectable in upper and lower  respiratory specimens during the acute phase of infection. The lowest  concentration of SARS-CoV-2 viral copies this assay can detect is 250  copies / mL. A negative result does not preclude SARS-CoV-2 infection  and should not be used as the sole basis for treatment or other  patient management decisions.  A negative result may occur with  improper specimen collection / handling, submission of specimen other  than nasopharyngeal swab, presence of viral mutation(s) within the  areas targeted by this assay, and inadequate number of viral copies  (<250 copies / mL). A negative result must be combined with clinical  observations, patient history, and epidemiological information. If result is POSITIVE SARS-CoV-2 target nucleic acids are DETECTED. The SARS-CoV-2 RNA is generally detectable in upper and lower  respiratory specimens dur ing the acute phase of infection.  Positive  results are indicative of active infection with SARS-CoV-2.  Clinical  correlation with patient history and other diagnostic information is  necessary to determine patient infection status.  Positive results do  not rule out bacterial infection or co-infection with other viruses. If result is PRESUMPTIVE POSTIVE SARS-CoV-2 nucleic acids MAY BE PRESENT.   A presumptive positive result was obtained on the submitted specimen  and confirmed on repeat  testing.  While 2019 novel coronavirus  (SARS-CoV-2) nucleic acids may be present in the submitted sample  additional confirmatory testing may be necessary for epidemiological  and / or clinical management purposes  to differentiate between  SARS-CoV-2 and other Sarbecovirus currently known to infect humans.  If clinically indicated additional testing with an alternate test  methodology [phone]) is advised. The SARS-CoV-2 RNA is generally  detectable in upper and lower respiratory sp ecimens during the acute  phase of infection. The expected result is Negative. Fact Sheet for Patients:  BoilerBrush.com.cy Fact Sheet for Healthcare Providers: https://pope.com/ This test is not yet approved or cleared by the [place] FDA and has been authorized for detection and/or diagnosis of SARS-CoV-2 by FDA under an Emergency Use Authorization (EUA).  This EUA will remain in effect (meaning this test can be used) for the duration of the COVID-19 declaration under Section 564(b)(1) of the Act, 21 U.S.C. section 360bbb-3(b)(1), unless the authorization is terminated or revoked sooner. Performed at [facility], [address], [place], [place] [address]      Studies: Dg Chest Port 1 View  Result Date: 03/07/2019 CLINICAL DATA:  GI bleed EXAM: PORTABLE CHEST 1 VIEW COMPARISON:  02/09/2019 FINDINGS: Low lung volumes. Mild  airspace disease at the left base. Elevated right diaphragm with colonic inter positioning beneath the right diaphragm. Stable cardiomediastinal silhouette. No pneumothorax. IMPRESSION: 1. Low lung volumes with atelectasis or mild infiltrate at the left base. Electronically Signed   By: Jasmine PangKim  Fujinaga M.D.   On: 03/07/2019 18:23    Scheduled Meds: . [START ON 03/09/2019] amLODipine  10 mg Oral Q M,W,F  . finasteride  5 mg Oral Daily  . FLUoxetine  10 mg Oral Daily  . losartan  50 mg Oral Daily  . pantoprazole  40 mg Oral BID   . predniSONE  30 mg Oral Q breakfast   Followed by  . [START ON 03/09/2019] predniSONE  20 mg Oral Q breakfast  . tamsulosin  0.4 mg Oral QPC breakfast  . traZODone  50 mg Oral QHS   Continuous Infusions: . sodium chloride    . sodium chloride 75 mL/hr at 03/08/19 [id number]    Principal Problem:   Respiratory failure with hypoxia (HCC) Active Problems:   Symptomatic anemia   GI bleed   Hyponatremia   Leukocytosis   Anxiety   BPH (benign prostatic hyperplasia)   Osteoarthritis    Time spent: 45 minutes    Elmore Community HospitalBLACK, M NP [facility]  If 7PM-7AM, please contact night-coverage at [email or web address], password [email or web address] 03/08/2019, 10:32 AM  LOS: 0 days

## 2019-03-08 NOTE — Consult Note (Signed)
[facility] Consult: 8:57 AM 03/08/2019  LOS: 0 days    Referring Provider: D  Primary Care Physician:  Charlott Rakes, MD Primary Gastroenterologist:  unassigned     Reason for Consultation:  Anemia, dark stool,   HPI: Tyler Petersen is a 85 y.o. male.  Hx htn.  Arthritis.  Anxiety.  BPH. Colonoscopy ~ 2012 and some years prior, Dr. Braulio Conte in [place].  Patient does not recall having had any colon polyps and he is a good historian.  7/8 -02/11/2019 admission to [facility] following a fall.  Diagnosed with traumatic rhabdomyolysis, UTI, non STEMI peak troponin 1.6.  EF 55%, impaired relaxation on echo.  Received heparin drip but discharged to [facility] on ASA (though no aspirin is listed on his incoming med list).  Had been living independently prior to this.  Discharge med list included Protonix 40 mg BID, which patient says started during recent hospitalization.  Prior to that he had never used acid suppressing medications  On 8/1 patient began a course of prednisone for osteoarthritis.  Dose began at 30 mg daily for 5 days.  Stools have become dark in the last 4 to 5 days.  These are formed, black/brown, not grossly bloody, occur 1-2 times daily.  This is not associated with anorexia, nausea, vomiting, abdominal pain.  He had never seen dark stools before.  Patient does say that in the last several days his legs have become weaker and his gait less steady but denies dizziness, dyspnea, chest pain, palpitations.  At SNF yesterday stool was FOBT positive and he was anemic, thus sent to ED for evaluation.. No previous issues with GI bleeding, anemia, transfusion requirement, bleeding issues.  Hgb 5.8 >> 2 PRBC >> 8.6.  MCV 93.  WBCs 20.7  >> 17.1.  Sodium 132.  Renal function normal. COVID 19  negative CXR with mild L base infiltrate.    Patient is a widower.  He has children living in the area. He is the only surviving sibling (#9) out of a family of 10. Patient does not recall any issues with anemia, bleeding, ulcers, colon cancer in his family.   Past Medical History:  Diagnosis Date  . Anxiety   . BPH (benign prostatic hyperplasia)   . HTN (hypertension)   . Hyponatremia   . Osteoarthritis     Past Surgical History:  Procedure Laterality Date  . ORIF right pelvic/hip  right     Prior to Admission medications   Medication Sig Start Date End Date Taking? Authorizing Provider  acetaminophen (TYLENOL) 325 MG tablet Take 650 mg by mouth every 6 (six) hours as needed.   Yes [provider]  amLODipine (NORVASC) 10 MG tablet Take 10 mg by mouth every Monday, Wednesday, and Friday.  01/16/19  Yes [provider]  bisacodyl (DULCOLAX) 10 MG suppository Place 10 mg rectally as needed for mild constipation, moderate constipation or severe constipation.   Yes [provider]  finasteride (PROSCAR) 5 MG tablet Take 5 mg by mouth daily.   Yes [provider]  FLUoxetine (PROZAC) 10 MG capsule Take 10 mg by mouth daily.   Yes [provider]  losartan (COZAAR) 50 MG tablet Take 50 mg by mouth daily.   Yes [provider]  magnesium hydroxide (MILK OF MAGNESIA) 400 MG/5ML suspension Take 30 mLs by mouth daily as needed for mild constipation or moderate constipation.   Yes [provider]  OXYGEN Inhale 2 L into the lungs continuous.   Yes [provider]  pantoprazole (PROTONIX) 40 MG tablet Take 40 mg by mouth 2 (two) times daily.   Yes [provider]  predniSONE (DELTASONE) 10 MG tablet Take 20-30 mg by mouth See admin instructions. Take 30 mg daily for 5 days for OA starting 03/05/19   Then take 20 mg daily for OA Starting 03/09/19   Yes [provider]  tamsulosin (FLOMAX) 0.4 MG CAPS  capsule Take 0.4 mg by mouth.   Yes [provider]  traZODone (DESYREL) 50 MG tablet Take 50 mg by mouth at bedtime. 02/10/19  Yes [provider]    Scheduled Meds: . [START ON 03/09/2019] amLODipine  10 mg Oral Q M,W,F  . finasteride  5 mg Oral Daily  . FLUoxetine  10 mg Oral Daily  . losartan  50 mg Oral Daily  . pantoprazole  40 mg Oral BID  . predniSONE  30 mg Oral Q breakfast   Followed by  . [START ON 03/09/2019] predniSONE  20 mg Oral Q breakfast  . tamsulosin  0.4 mg Oral QPC breakfast  . traZODone  50 mg Oral QHS   Infusions: . sodium chloride    . sodium chloride 75 mL/hr at 03/08/19 0734   PRN Meds: acetaminophen **OR** acetaminophen, albuterol, ondansetron **OR** ondansetron (ZOFRAN) IV   Allergies as of 03/07/2019 - Review Complete 03/07/2019  Allergen Reaction Noted  . Influenza vaccines  03/07/2019    No family history on file.  Social History   Socioeconomic History  . Marital status: Widowed    Spouse name: Not on file  . Number of children: Not on file  . Years of education: Not on file  . Highest education level: Not on file  Occupational History  . Not on file  Social Needs  . Financial resource strain: Not on file  . Food insecurity    Worry: Not on file    Inability: Not on file  . Transportation needs    Medical: Not on file    Non-medical: Not on file  Tobacco Use  . Smoking status: Never Smoker  . Smokeless tobacco: Never Used  Substance and Sexual Activity  . Alcohol use: Not on file  . Drug use: Never  . Sexual activity: Not Currently  Lifestyle  . Physical activity    Days per week: Not on file    Minutes per session: Not on file  . Stress: Not on file  Relationships  . Social Musicianconnections    Talks on phone: Not on file    Gets together: Not on file    Attends religious service: Not on file    Active member of club or organization: Not on file    Attends meetings of clubs or organizations: Not on file     Relationship status: Not on file  . Intimate partner violence    Fear of current or ex partner: Not on file    Emotionally abused: Not on file    Physically abused: Not on file    Forced sexual activity: Not  on file  Other Topics Concern  . Not on file  Social History Narrative  . Not on file    REVIEW OF SYSTEMS: Constitutional: See HPI ENT:  No nose bleeds Pulm: No labored breathing or cough. CV:  No palpitations, no LE edema.  Chest pain GU:  No hematuria, no frequency.  No oliguria. GI: See HPI. Heme: Denies unusual bleeding or bruising. Transfusions: No previous transfusions prior to last night. Neuro:  No headaches, no peripheral tingling or numbness Derm:  No itching, no rash or sores.  Endocrine:  No sweats or chills.  No polyuria or dysuria Immunization: Not queried. Travel:  None beyond local counties in last few months.    PHYSICAL EXAM: Vital signs in last 24 hours: Vitals:   03/08/19 0028 03/08/19 0536  BP: (!) 105/43 (!) 134/46  Pulse: 67 67  Resp: 17 16  Temp: (!) 97.5 F (36.4 C) (!) 97.5 F (36.4 C)  SpO2: 100% 100%   Wt Readings from Last 3 Encounters:  03/08/19 81.6 kg    General: Aged, pleasant, alert, comfortable.   Looks frail but not acutely ill. Head: Facial asymmetry or swelling.  No signs of head trauma. Eyes: EOMI.  No conjunctival pallor or scleral icterus. Ears: Slight HOH Nose: No congestion or discharge. Mouth: Oral mucosa pink, moist, clear.  Tongue midline. Neck: No JVD, no thyromegaly, no masses. Lungs: Clear bilaterally.  No labored breathing or cough. Heart: RRR.  No MRG.  S1, S2 present. Abdomen: Soft.  Not tender.  Active bowel sounds.  No HSM, masses, hernias, bruits.   Rectal: Deferred. Musc/Skeltl: No joint redness or swelling.  No gross joint deformity other than the arthritic changes in the hands/fingers. Extremities: No CCE.  Feet are warm with brisk capillary refill Neurologic: Fully alert and oriented.  Excellent  historian.  Moves all 4 limbs, strength not tested.  No tremors or gross deficits. Skin: No tears, no significant purpura or bruising. Tattoos: None seen. Nodes: No cervical adenopathy. Psych: Pleasant, calm, cooperative, polite.  Intake/Output from previous day: 08/03 0701 - 08/04 0700 In: 1348.8 [P.O.:240; I.V.:356.1; Blood:752.7] Out: -  Intake/Output this shift: No intake/output data recorded.  LAB RESULTS: Recent Labs    03/07/19 1510 03/08/19 0042  WBC 20.7* 17.1*  HGB 5.8* 8.6*  HCT 18.5* 25.4*  PLT 291 202   BMET Lab Results  Component Value Date   NA 132 (L) 03/08/2019   NA 127 (L) 03/07/2019   K 4.7 03/08/2019   K 5.0 03/07/2019   CL 99 03/08/2019   CL 94 (L) 03/07/2019   CO2 27 03/08/2019   CO2 24 03/07/2019   GLUCOSE 99 03/08/2019   GLUCOSE 157 (H) 03/07/2019   BUN 11 03/08/2019   BUN 19 03/07/2019   CREATININE 0.56 (L) 03/08/2019   CREATININE 0.73 03/07/2019   CALCIUM 8.0 (L) 03/08/2019   CALCIUM 8.0 (L) 03/07/2019   LFT Recent Labs    03/07/19 1510  PROT 4.6*  ALBUMIN 2.7*  AST 22  ALT 27  ALKPHOS 39  BILITOT 0.5   PT/INR No results found for: INR, PROTIME Hepatitis Panel No results for input(s): HEPBSAG, HCVAB, HEPAIGM, HEPBIGM in the last 72 hours. C-Diff No components found for: CDIFF Lipase  No results found for: LIPASE  Drugs of Abuse  No results found for: LABOPIA, COCAINSCRNUR, LABBENZ, AMPHETMU, THCU, LABBARB   RADIOLOGY STUDIES: Dg Chest Port 1 View  Result Date: 03/07/2019 CLINICAL DATA:  GI bleed EXAM: PORTABLE CHEST 1 VIEW  COMPARISON:  02/09/2019 FINDINGS: Low lung volumes. Mild airspace disease at the left base. Elevated right diaphragm with colonic inter positioning beneath the right diaphragm. Stable cardiomediastinal silhouette. No pneumothorax. IMPRESSION: 1. Low lung volumes with atelectasis or mild infiltrate at the left base. Electronically Signed   By: Jasmine PangKim  Fujinaga M.D.   On: 03/07/2019 18:23     IMPRESSION:    *     FOBT positive, dark/black stools.  Rule out ulcer, rule out AVMs, rule out neoplasia.  *     Normocytic anemia.  Excellent response to PRBC x 2.      PLAN:     *    EGD today.  Patient is n.p.o.  Patient agreeable and would like procedure to be done as soon as possible.   Jennye MoccasinSarah Rhian Asebedo  03/08/2019, 8:57 AM Phone [phone]

## 2019-03-08 NOTE — Transfer of Care (Signed)
Immediate Anesthesia Transfer of Care Note  Patient: Tyler Petersen  Procedure(s) Performed: ESOPHAGOGASTRODUODENOSCOPY (EGD) WITH PROPOFOL (N/A )  Patient Location: Endoscopy Unit  Anesthesia Type:MAC  Level of Consciousness: drowsy  Airway & Oxygen Therapy: Patient Spontanous Breathing and Patient connected to nasal cannula oxygen  Post-op Assessment: Report given to RN and Post -op Vital signs reviewed and stable  Post vital signs: Reviewed and stable  Last Vitals:  Vitals Value Taken Time  BP    Temp    Pulse    Resp    SpO2      Last Pain:  Vitals:   03/08/19 1150  TempSrc: Temporal  PainSc: 0-No pain         Complications: No apparent anesthesia complications

## 2019-03-08 NOTE — Plan of Care (Signed)

## 2019-03-09 ENCOUNTER — Encounter (HOSPITAL_COMMUNITY): Payer: Self-pay | Admitting: Internal Medicine

## 2019-03-09 ENCOUNTER — Inpatient Hospital Stay (HOSPITAL_COMMUNITY): Payer: Medicare Other | Admitting: Certified Registered Nurse Anesthetist

## 2019-03-09 ENCOUNTER — Encounter (HOSPITAL_COMMUNITY)
Admission: EM | Disposition: A | Discharge status: Skilled Nursing Facility | Payer: Medicare Other | Source: Home / Self Care | Attending: Internal Medicine

## 2019-03-09 DIAGNOSIS — M1991 Primary osteoarthritis, unspecified site: Secondary | ICD-10-CM

## 2019-03-09 HISTORY — PX: COLONOSCOPY WITH PROPOFOL: SHX5780

## 2019-03-09 LAB — BASIC METABOLIC PANEL
Anion gap: 8 (ref 5–15)
BUN: 5 mg/dL — ABNORMAL LOW (ref 8–23)
CO2: 27 mmol/L (ref 22–32)
Calcium: 7.8 mg/dL — ABNORMAL LOW (ref 8.9–10.3)
Chloride: 101 mmol/L (ref 98–111)
Creatinine, Ser: 0.53 mg/dL — ABNORMAL LOW (ref 0.61–1.24)
GFR calc Af Amer: >60 mL/min (ref >60)
GFR calc non Af Amer: >60 mL/min (ref >60)
Glucose, Bld: 96 mg/dL (ref 70–99)
Potassium: 3.9 mmol/L (ref 3.5–5.1)
Sodium: 136 mmol/L (ref 135–145)

## 2019-03-09 LAB — CBC
HCT: 28.8 % — ABNORMAL LOW (ref 39.0–52.0)
HCT: 25.4 % — ABNORMAL LOW (ref 39.0–52.0)
Hemoglobin: 9.6 g/dL — ABNORMAL LOW (ref 13.0–17.0)
Hemoglobin: 8.6 g/dL — ABNORMAL LOW (ref 13.0–17.0)
MCH: 31.7 pg (ref 26.0–34.0)
MCH: 31.5 pg (ref 26.0–34.0)
MCHC: 33.3 g/dL (ref 30.0–36.0)
MCHC: 33.9 g/dL (ref 30.0–36.0)
MCV: 95 fL (ref 80.0–100.0)
MCV: 93 fL (ref 80.0–100.0)
Platelets: 221 10*3/uL (ref 150–400)
Platelets: 202 10*3/uL (ref 150–400)
RBC: 3.03 MIL/uL — ABNORMAL LOW (ref 4.22–5.81)
RBC: 2.73 MIL/uL — ABNORMAL LOW (ref 4.22–5.81)
RDW: 15.8 % — ABNORMAL HIGH (ref 11.5–15.5)
RDW: 14.9 % (ref 11.5–15.5)
WBC: 12.6 10*3/uL — ABNORMAL HIGH (ref 4.0–10.5)
WBC: 17.1 10*3/uL — ABNORMAL HIGH (ref 4.0–10.5)
nRBC: 0 % (ref 0.0–0.2)
nRBC: 0 % (ref 0.0–0.2)

## 2019-03-09 SURGERY — COLONOSCOPY WITH PROPOFOL
Anesthesia: Monitor Anesthesia Care

## 2019-03-09 MED ORDER — PANTOPRAZOLE SODIUM 40 MG PO TBEC
40.0000 mg | DELAYED_RELEASE_TABLET | Freq: Every day | ORAL | Status: DC
  Administered 2019-03-10 – 2019-03-11 (×2): 40 mg via ORAL
  Filled 2019-03-09 (×2): qty 1

## 2019-03-09 MED ORDER — LIDOCAINE 2% (20 MG/ML) 5 ML SYRINGE
INTRAMUSCULAR | Status: DC | PRN
  Administered 2019-03-09: 40 mg via INTRAVENOUS

## 2019-03-09 MED ORDER — PROPOFOL 500 MG/50ML IV EMUL
INTRAVENOUS | Status: DC | PRN
  Administered 2019-03-09: 150 ug/kg/min via INTRAVENOUS

## 2019-03-09 MED ORDER — KETOCONAZOLE 2 % EX CREA
1.0000 "application " | TOPICAL_CREAM | Freq: Two times a day (BID) | CUTANEOUS | Status: DC
  Administered 2019-03-09 – 2019-03-11 (×4): 1 via TOPICAL
  Filled 2019-03-09: qty 15

## 2019-03-09 MED ORDER — EPHEDRINE SULFATE-NACL 50-0.9 MG/10ML-% IV SOSY
PREFILLED_SYRINGE | INTRAVENOUS | Status: DC | PRN
  Administered 2019-03-09: 5 mg via INTRAVENOUS
  Administered 2019-03-09: 10 mg via INTRAVENOUS

## 2019-03-09 MED ORDER — PHENYLEPHRINE 40 MCG/ML (10ML) SYRINGE FOR IV PUSH (FOR BLOOD PRESSURE SUPPORT)
PREFILLED_SYRINGE | INTRAVENOUS | Status: DC | PRN
  Administered 2019-03-09: 80 ug via INTRAVENOUS
  Administered 2019-03-09 (×2): 120 ug via INTRAVENOUS
  Administered 2019-03-09: 80 ug via INTRAVENOUS
  Administered 2019-03-09: 120 ug via INTRAVENOUS
  Administered 2019-03-09: 80 ug via INTRAVENOUS

## 2019-03-09 SURGICAL SUPPLY — 21 items

## 2019-03-09 NOTE — Anesthesia Preprocedure Evaluation (Addendum)
Anesthesia Evaluation  Patient identified by MRN, date of birth, ID band Patient awake    Reviewed: Allergy & Precautions, NPO status , Patient's Chart, lab work & pertinent test results  Airway Mallampati: II  TM Distance: >3 FB Neck ROM: Full    Dental  (+) Missing   Pulmonary  Oxygen dependent since discharge [facility]   Pulmonary exam normal breath sounds clear to auscultation       Cardiovascular hypertension, Pt. on medications + CAD and + Past MI  Normal cardiovascular exam Rhythm:Regular Rate:Normal  ECG: rate 77. Sinus rhythm Left axis deviation   Neuro/Psych PSYCHIATRIC DISORDERS Anxiety slurred speech speech impediment    GI/Hepatic Neg liver ROS, Bowel prep,GERD  Medicated and Controlled,  Endo/Other  negative endocrine ROS  Renal/GU negative Renal ROS     Musculoskeletal  (+) Arthritis ,   Abdominal   Peds  Hematology  (+) anemia ,   Anesthesia Other Findings heme + stools and anemia  Reproductive/Obstetrics                            Anesthesia Physical Anesthesia Plan  ASA: IV  Anesthesia Plan: MAC   Post-op Pain Management:    Induction: Intravenous  PONV Risk Score and Plan: 1 and Propofol infusion and Treatment may vary due to age or medical condition  Airway Management Planned: Simple Face Mask  Additional Equipment:   Intra-op Plan:   Post-operative Plan:   Informed Consent: I have reviewed the patients History and Physical, chart, labs and discussed the procedure including the risks, benefits and alternatives for the proposed anesthesia with the patient or authorized representative who has indicated his/her understanding and acceptance.     Dental advisory given  Plan Discussed with: CRNA  Anesthesia Plan Comments:         Anesthesia Quick Evaluation

## 2019-03-09 NOTE — Anesthesia Postprocedure Evaluation (Signed)
Anesthesia Post Note  Patient: Tyler Petersen  Procedure(s) Performed: ESOPHAGOGASTRODUODENOSCOPY (EGD) WITH PROPOFOL (N/A )     Patient location during evaluation: Endoscopy Anesthesia Type: MAC Level of consciousness: awake and alert Pain management: pain level controlled Vital Signs Assessment: post-procedure vital signs reviewed and stable Respiratory status: spontaneous breathing, nonlabored ventilation, respiratory function stable and patient connected to nasal cannula oxygen Cardiovascular status: stable and blood pressure returned to baseline Postop Assessment: no apparent nausea or vomiting Anesthetic complications: no    Last Vitals:  Vitals:   03/09/19 0516 03/09/19 1133  BP: 90/73 135/70  Pulse: 68 78  Resp: 18 18  Temp: (!) 36.4 C (!) 36.2 C  SpO2: 98% 97%    Last Pain:  Vitals:   03/09/19 1133  TempSrc: Oral  PainSc:                  Tyler Petersen

## 2019-03-09 NOTE — NC FL2 (Signed)
MEDICAID FL2 LEVEL OF CARE SCREENING TOOL     IDENTIFICATION  Patient Name: Tyler Petersen Birthdate: 01-12-1929 Sex: male Admission Date (Current Location): 03/07/2019  [facility] and [place] Number:  [employer] and Address:  [facility], [address] [place], [place] [address]      Provider Number: [id number]  Attending Physician Name and Address:  Fran Lowes, DO  Relative Name and Phone Number:  Casimiro Needle; son; [phone]    Current Level of Care: Hospital Recommended Level of Care: Skilled Nursing Facility Prior Approval Number:    Date Approved/Denied:   PASRR Number: [id number]  Discharge Plan: SNF    Current Diagnoses: Patient Active Problem List   Diagnosis Date Noted  . NSTEMI (non-ST elevated myocardial infarction) (HCC)   . Acute blood loss anemia   . Osteoarthritis   . Heme positive stool   . Symptomatic anemia 03/07/2019  . GI bleed 03/07/2019  . Anxiety 03/07/2019  . BPH (benign prostatic hyperplasia) 03/07/2019  . Respiratory failure with hypoxia (HCC) 03/07/2019  . Hyponatremia 03/07/2019  . Leukocytosis 03/07/2019    Orientation RESPIRATION BLADDER Height & Weight     Self, Time, Situation, Place  Normal Continent, External catheter Weight: 179 lb (81.2 kg) Height:  5\' 8"  (172.7 cm)  BEHAVIORAL SYMPTOMS/MOOD NEUROLOGICAL BOWEL NUTRITION STATUS      Continent Diet(see discharge summary)  AMBULATORY STATUS COMMUNICATION OF NEEDS Skin   Extensive Assist Verbally Skin abrasions, Other (Comment)(skin abrasions on knee and ankles; generalized ecchymosis; rash on bottom, groin, thigh)                       Personal Care Assistance Level of Assistance  Bathing, Feeding, Dressing Bathing Assistance: Maximum assistance Feeding assistance: Independent Dressing Assistance: Maximum assistance     Functional Limitations Info  Sight, Hearing, Speech Sight Info: Adequate Hearing Info:  Adequate Speech Info: Adequate    SPECIAL CARE FACTORS FREQUENCY  OT (By licensed OT), PT (By licensed PT)     PT Frequency: 5x week OT Frequency: 5x week            Contractures Contractures Info: Not present    Additional Factors Info  Code Status, Allergies, Psychotropic Code Status Info: Full Code Allergies Info: Influenza Vaccines Psychotropic Info: FLUoxetine (PROZAC) capsule 10 mg FLUoxetine (PROZAC) capsule 10 mg  daily PO; traZODone (DESYREL) tablet 50 mg daily at bedtime PO         Current Medications (03/09/2019):  This is the current hospital active medication list Current Facility-Administered Medications  Medication Dose Route Frequency Provider Last Rate Last Dose  . acetaminophen (TYLENOL) tablet 650 mg  650 mg Oral Q6H PRN Clydie Braun, MD       Or  . acetaminophen (TYLENOL) suppository 650 mg  650 mg Rectal Q6H PRN Smith, Rondell A, MD      . albuterol (PROVENTIL) (2.5 MG/3ML) 0.083% nebulizer solution 2.5 mg  2.5 mg Nebulization Q6H PRN Smith, Rondell A, MD      . amLODipine (NORVASC) tablet 10 mg  10 mg Oral Q M,W,F Smith, Rondell A, MD   10 mg at 03/09/19 1620  . finasteride (PROSCAR) tablet 5 mg  5 mg Oral Daily Madelyn Flavors A, MD   5 mg at 03/09/19 1619  . FLUoxetine (PROZAC) capsule 10 mg  10 mg Oral Daily Katrinka Blazing, Rondell A, MD   10 mg at 03/09/19 1620  . ketoconazole (NIZORAL) 2 %  cream   Topical BID Pyrtle, Lajuan Lines, MD      . losartan (COZAAR) tablet 50 mg  50 mg Oral Daily Tamala Julian, Rondell A, MD   50 mg at 03/09/19 1620  . ondansetron (ZOFRAN) tablet 4 mg  4 mg Oral Q6H PRN Fuller Plan A, MD       Or  . ondansetron (ZOFRAN) injection 4 mg  4 mg Intravenous Q6H PRN Fuller Plan A, MD      . Derrill Memo ON 03/10/2019] pantoprazole (PROTONIX) EC tablet 40 mg  40 mg Oral QAC breakfast Pyrtle, Lajuan Lines, MD      . predniSONE (DELTASONE) tablet 20 mg  20 mg Oral Q breakfast Tamala Julian, Rondell A, MD   20 mg at 03/09/19 1620  . tamsulosin (FLOMAX) capsule 0.4 mg   0.4 mg Oral QPC breakfast Tamala Julian, Rondell A, MD   0.4 mg at 03/09/19 1620  . traZODone (DESYREL) tablet 50 mg  50 mg Oral QHS Fuller Plan A, MD   50 mg at 03/08/19 2102     Discharge Medications: Please see discharge summary for a list of discharge medications.  Relevant Imaging Results:  Relevant Lab Results:   Additional Information SS# [place], [place]

## 2019-03-09 NOTE — Plan of Care (Signed)

## 2019-03-09 NOTE — Transfer of Care (Signed)
Immediate Anesthesia Transfer of Care Note  Patient: Tyler Petersen  Procedure(s) Performed: COLONOSCOPY WITH PROPOFOL (N/A )  Patient Location: Endoscopy Unit  Anesthesia Type:MAC  Level of Consciousness: awake, alert , oriented and patient cooperative  Airway & Oxygen Therapy: Patient Spontanous Breathing and Patient connected to nasal cannula oxygen  Post-op Assessment: Report given to RN and Post -op Vital signs reviewed and stable  Post vital signs: Reviewed and stable  Last Vitals:  Vitals Value Taken Time  BP 93/33 03/09/19 1509  Temp 36.5 C 03/09/19 1509  Pulse 73 03/09/19 1509  Resp 20 03/09/19 1509  SpO2 100 % 03/09/19 1509    Last Pain:  Vitals:   03/09/19 1509  TempSrc: Axillary  PainSc: Asleep         Complications: No apparent anesthesia complications

## 2019-03-09 NOTE — Anesthesia Procedure Notes (Signed)
Procedure Name: MAC Date/Time: 03/09/2019 2:36 PM Performed by: Colin Benton, CRNA Pre-anesthesia Checklist: Patient identified, Suction available, Patient being monitored and Emergency Drugs available Patient Re-evaluated:Patient Re-evaluated prior to induction Oxygen Delivery Method: Nasal cannula Induction Type: IV induction Placement Confirmation: positive ETCO2 Dental Injury: Teeth and Oropharynx as per pre-operative assessment

## 2019-03-09 NOTE — Op Note (Addendum)
[facility] Patient Name: Tyler CottaKenneth Petersen Procedure Date : 03/09/2019 MRN: [id number] Attending MD: Beverley FiedlerJay M Maximilliano Kersh , MD Date of Birth: 07-Feb-1929 CSN: [id number] Age: 85 Admit Type: Inpatient Procedure:                Colonoscopy Indications:              Heme positive stool Providers:                Carie CaddyJay M. Rhea BeltonPyrtle, MD, Margaree MackintoshHayleigh Westmoreland, RN,                            Brion AlimentShayla Proctor, Technician Referring MD:             [facility] Medicines:                Monitored Anesthesia Care Complications:            No immediate complications. Estimated Blood Loss:     Estimated blood loss: none. Procedure:                Pre-Anesthesia Assessment:                           - Prior to the procedure, a History and Physical                            was performed, and patient medications and                            allergies were reviewed. The patient's tolerance of                            previous anesthesia was also reviewed. The risks                            and benefits of the procedure and the sedation                            options and risks were discussed with the patient.                            All questions were answered, and informed consent                            was obtained. Prior Anticoagulants: The patient has                            taken no previous anticoagulant or antiplatelet                            agents. ASA Grade Assessment: III - A patient with                            severe systemic disease. After reviewing the risks  and benefits, the patient was deemed in                            satisfactory condition to undergo the procedure.                           After obtaining informed consent, the colonoscope                            was passed under direct vision. Throughout the                            procedure, the patient's blood pressure, pulse, and   oxygen saturations were monitored continuously. The                            CF-HQ190L ([id number]) Olympus colonoscope was                            introduced through the anus and advanced to the                            terminal ileum. The colonoscopy was performed                            without difficulty. The patient tolerated the                            procedure well. The quality of the bowel                            preparation was good. Scope In: 2:42:42 PM Scope Out: 3:02:22 PM Scope Withdrawal Time: 0 hours 12 minutes 48 seconds  Total Procedure Duration: 0 hours 19 minutes 40 seconds  Findings:      The perianal exam findings include a perianal rash. This appears at       least in past fungal in nature. Large surface area affected.      The digital rectal exam was normal.      The terminal ileum appeared normal.      Multiple small and large-mouthed diverticula were found in the sigmoid       colon, descending colon, transverse colon, hepatic flexure and ascending       colon.      Internal hemorrhoids were found during retroflexion. The hemorrhoids       were medium-sized.      The exam was otherwise without abnormality. Impression:               - The examined portion of the ileum was normal.                           - Moderate diverticulosis in the sigmoid colon, in                            the descending colon, in the transverse colon, at  the hepatic flexure and in the ascending colon.                           - Internal hemorrhoids.                           - The examination was otherwise normal.                           - No source for GI bleeding found on this                            examination.                           - No specimens collected. Moderate Sedation:      N/A Recommendation:           - Return patient to hospital ward for ongoing care.                           - Resume previous diet.                            - Continue present medications.                           - Monitor Hgb and iron studies after discharge.                            Replace iron if needed.                           - If recurrent anemia or evidence of melena, then                            next test would be video capsule endoscopy.                           - Anti-fungal therapy recommended for                            perineal/perianal rash and dermatology consultation                            if not improving.                           - GI will sign off, call with questions. Procedure Code(s):        --- Professional ---                           [id number], Colonoscopy, flexible; diagnostic, including                            collection of specimen(s) by brushing or washing,  when performed (separate procedure) Diagnosis Code(s):        --- Professional ---                           K64.8, Other hemorrhoids                           R19.5, Other fecal abnormalities                           K57.30, Diverticulosis of large intestine without                            perforation or abscess without bleeding CPT copyright 2019 American Medical Association. All rights reserved. The codes documented in this report are preliminary and upon coder review may  be revised to meet current compliance requirements. Jerene Bears, MD 03/09/2019 3:18:44 PM This report has been signed electronically. Number of Addenda: 0

## 2019-03-09 NOTE — Anesthesia Postprocedure Evaluation (Signed)
Anesthesia Post Note  Patient: Tyler Petersen  Procedure(s) Performed: COLONOSCOPY WITH PROPOFOL (N/A )     Patient location during evaluation: PACU Anesthesia Type: MAC Level of consciousness: awake and alert Pain management: pain level controlled Vital Signs Assessment: post-procedure vital signs reviewed and stable Respiratory status: spontaneous breathing, nonlabored ventilation, respiratory function stable and patient connected to nasal cannula oxygen Cardiovascular status: stable and blood pressure returned to baseline Postop Assessment: no apparent nausea or vomiting Anesthetic complications: no    Last Vitals:  Vitals:   03/09/19 1620 03/09/19 2015  BP: 136/69 (!) 109/48  Pulse: 74 87  Resp: 18 20  Temp:  (!) 36.4 C  SpO2: 97% 95%    Last Pain:  Vitals:   03/09/19 2015  TempSrc: Oral  PainSc:                  [name]

## 2019-03-09 NOTE — Progress Notes (Signed)
PROGRESS NOTE  Jarvell Pyon IRJ:[id number] DOB: [birth year] DOA: 03/07/2019 PCP: Charlott Rakes, MD  Brief History   Chatham Bredehoft is a 85 y.o. male here with GI bleed and symptomatic anemia.  S/p EGD: normal exam, plan for Clear liquid diet and Discuss possible colonoscopy to further evaluate dark heme positive stools and anemia.  Consultants  . Gastroenteroloty  Procedures  . EGD  Antibiotics   Anti-infectives (From admission, onward)   None    .  Subjective  The patient is resting comfortably. No new complaints.  Objective   Vitals:  Vitals:   03/09/19 1411 03/09/19 1412  BP:  (!) 158/75  Pulse: 94   Resp: (!) 21   Temp: (!) 96.6 F (35.9 C)   SpO2: 97%     Exam:  Constitutional:  . The patient is awake and alert. He is in no acute distress. Respiratory:  . There is no increased work of breathing. . No wheezes, rales, or rhonchi. . Normoactive bowel sounds.  Cardiovascular:  . Regular rate and rhythm . No murmurs, ectopy, or gallups. . No lateral PMI. No thrills.  Abdomen:  . Abdomen is soft, non-tender, no-distended. . No hernias, masses, or organomegaly . Normoactive bowel sounds. Musculoskeletal:  . No cyanosis, clubbing, or edema Skin:  . No rashes, lesions, ulcers . palpation of skin: no induration or nodules Neurologic:  . CN 2-12 intact . Sensation all 4 extremities intact Psychiatric:  . Mental status o Mood, affect appropriate o Orientation to person  I have personally reviewed the following:   Today's Data  . Vitals, CBC, BMP  Other Data  . EGD, Erythema of stomach, but no evidence of acute bleeding in upper GI tract.  Scheduled Meds: . [MAR Hold] amLODipine  10 mg Oral Q M,W,F  . [MAR Hold] finasteride  5 mg Oral Daily  . [MAR Hold] FLUoxetine  10 mg Oral Daily  . [MAR Hold] losartan  50 mg Oral Daily  . [MAR Hold] pantoprazole  40 mg Oral BID  . [MAR Hold] predniSONE  20 mg Oral Q breakfast  . [MAR Hold]  tamsulosin  0.4 mg Oral QPC breakfast  . [MAR Hold] traZODone  50 mg Oral QHS   Continuous Infusions: . sodium chloride      Principal Problem:   Respiratory failure with hypoxia (HCC) Active Problems:   Symptomatic anemia   GI bleed   Anxiety   BPH (benign prostatic hyperplasia)   Hyponatremia   Leukocytosis   Osteoarthritis   Heme positive stool   LOS: 1 day   A & P  GI bleed/ symptomatic anemia: etiology unclear. Was started on steroids several days ago. S/P 2 units PRBC's. Hg. 8.6. The patient has been evaluated by GI, and EGD was performed on 03/08/2019. This demonstrated no putative source of bleeding. They plan to take the patient to colonoscopy today. Hemoglobin is stable and is 9.6 this morning compared to 8.6 yesterday. The patient is receiving Protonix 40 mg twice daily. ASA is held. Hemoglobin is being followed. Transfusion for symptomatic anemia or hemoglobin under 7.0. I appreciate gastroenterology's assistance.  Leukocytosis: Declining. WBC elevated 20.7 on admission and 12.6 this am. Continue to monitor. Possibly due to inflammation. Monitor. Recordsshow WBC elevated up to 27, but treated for urinary tract infection during admission at [facility] in July. WBC was noted to decreased down to 18 prior to discharge. Urinalysis unremarkable. Chest xray with low lung volumes with atelectasis or mild infiltrate at left base.  He is afebrile and non-toxic appearing.  Hyponatremia: Acute on chronic. Sodium 127 on admission and 136 today. Baseline sodium appears to be around 133. During previous hospitalization at [facility]. Plan to dc NS infusion and monitor.  Respiratory failure with hypoxia: Resolving. Patient is currently saturating 100% on room air. Monitor. Will check ambulatory SaO2 before discharge. Likely hypoxia is due to atelectasis. Incentive spirometry seems to have helped.  Osteoarthritis: Patient on prednisone. Possibly important in terms of gastritis  seen on EGD.   Steroid-induced hyperglycemia: Blood glucose initially mildly elevated at 157. This am serum glucose 99.Last hemoglobin A1c 5.6 in7/2020. Monitor.  Anxiety: Patient has history of anxiety and has been on Prozac for quite some time. Continue Prozac.  Osteoarthritis: Patient on regimen of steroids currently. Start to wean the patient off of steroids.  BPH: Continue Proscar and Flomax.  I have seen and examined this patient myself. I have spent 32 minutes in his evaluation and care.  Code Status: full Family Communication: son michael on phone updated Disposition Plan: back to facility DVT Prophylaxis: SCD's  Nilo Fallin, DO [facility] Direct contact: see [email or web address]  7PM-7AM contact night coverage as above 03/09/2019, 3:04 PM  LOS: 1 day

## 2019-03-09 NOTE — Progress Notes (Signed)
Endo called for report on pt, to pick up around 2:30 today, reported that pt has completed prep, he is currently on bsc with lqiuid stool but unable to fully assess color or solid at this time, per  Report he had 2 loose with some solid pieces last night, pt has been NPO this am except prep

## 2019-03-09 NOTE — Social Work (Addendum)
Pt from [facility] where he has been for short term rehab.  The facility is amenable to him returning at discharge if needed.  Westley Hummer, MSW, [employer] [phone]

## 2019-03-10 ENCOUNTER — Encounter (HOSPITAL_COMMUNITY): Payer: Self-pay | Admitting: Internal Medicine

## 2019-03-10 LAB — HEMOGLOBIN AND HEMATOCRIT, BLOOD
HCT: 30.1 % — ABNORMAL LOW (ref 39.0–52.0)
Hemoglobin: 9.8 g/dL — ABNORMAL LOW (ref 13.0–17.0)

## 2019-03-10 LAB — SARS CORONAVIRUS 2 (TAT 6-24 HRS): SARS Coronavirus 2: NEGATIVE

## 2019-03-10 MED ORDER — PREDNISONE 20 MG PO TABS: 20.0000 mg | ORAL_TABLET | Freq: Every day | ORAL | 0 refills | Status: DC

## 2019-03-10 MED ORDER — PREDNISONE 20 MG PO TABS: ORAL_TABLET | ORAL | 0 refills | Status: AC

## 2019-03-10 MED ORDER — PREDNISONE 5 MG PO TABS: 5.0000 mg | ORAL_TABLET | Freq: Every day | ORAL | 0 refills | Status: AC

## 2019-03-10 MED ORDER — ALUM & MAG HYDROXIDE-SIMETH 200-200-20 MG/5ML PO SUSP
15.0000 mL | ORAL | Status: DC | PRN
  Filled 2019-03-10: qty 30

## 2019-03-10 NOTE — Care Management Important Message (Signed)
Important Message  Patient Details  Name: Tyler Petersen MRN: [id number] Date of Birth: 1929-02-14   Medicare Important Message Given:  Yes     Shelda Altes 03/10/2019, 11:58 AM

## 2019-03-10 NOTE — Progress Notes (Signed)
Late entry- this nurse saw discharge orders for pt, I spoke to to CM who advised the Covid test needs to result before he can return to facility and no plan to discharge today

## 2019-03-10 NOTE — Evaluation (Signed)
Physical Therapy Evaluation Patient Details Name: Tyler Petersen MRN: [id number] DOB: [birth year] Today's Date: 03/10/2019   History of Present Illness  PAtient is an 85 year old male admitted from [facility] (there for rehab) due to symptomatic anemia, dark stools, low BP. PMH to include fall, anxiety, OA.  Clinical Impression  Patient received in recliner, daughter present. Patient requesting to get out of the chair and back to bed. "You might need help." Patient requires min assist to perform sit to stand from recliner, min assist with taking a few steps from recliner to bed about 4 feet using RW. Unsteady and tremors during mobility. Patient will benefit from skilled PT while here to improve functional mobility, strength and safety.     Follow Up Recommendations SNF    Equipment Recommendations  None recommended by PT    Recommendations for Other Services       Precautions / Restrictions Precautions Precautions: Fall Restrictions Weight Bearing Restrictions: No      Mobility  Bed Mobility Overal bed mobility: Needs Assistance Bed Mobility: Sit to Supine       Sit to supine: Min guard   General bed mobility comments: required assistance to straighten out in bed  Transfers Overall transfer level: Needs assistance Equipment used: Rolling walker (2 wheeled) Transfers: Sit to/from Stand Sit to Stand: Min assist            Ambulation/Gait Ambulation/Gait assistance: Min Web designer (Feet): 4 Feet Assistive device: Rolling walker (2 wheeled) Gait Pattern/deviations: Step-to pattern;Decreased stride length Gait velocity: decreased   General Gait Details: patient unsteady with mobility, requires assistance and RW for safety  Stairs            Wheelchair Mobility    Modified Rankin (Stroke Patients Only)       Balance Overall balance assessment: Needs assistance Sitting-balance support: No upper extremity supported;Feet  supported Sitting balance-Leahy Scale: Good     Standing balance support: Bilateral upper extremity supported Standing balance-Leahy Scale: Poor                               Pertinent Vitals/Pain Pain Assessment: No/denies pain    Home Living Family/patient expects to be discharged to:: Skilled nursing facility Living Arrangements: Alone               Additional Comments: daughter checks on him daily    Prior Function Level of Independence: Independent         Comments: was independent prior to fall which led him to recent rehab stay, currently using walker for mobility     Hand Dominance        Extremity/Trunk Assessment   Upper Extremity Assessment Upper Extremity Assessment: Generalized weakness    Lower Extremity Assessment Lower Extremity Assessment: Generalized weakness    Cervical / Trunk Assessment Cervical / Trunk Assessment: Kyphotic  Communication   Communication: No difficulties;Other (comment)(slight slurred speech)  Cognition Arousal/Alertness: Awake/alert Behavior During Therapy: WFL for tasks assessed/performed Overall Cognitive Status: Within Functional Limits for tasks assessed                                        General Comments      Exercises     Assessment/Plan    PT Assessment Patient needs continued PT services  PT Problem List Decreased strength;Decreased  mobility;Decreased activity tolerance;Decreased balance       PT Treatment Interventions Therapeutic activities;Gait training;Therapeutic exercise;Patient/family education;Functional mobility training;Balance training    PT Goals (Current goals can be found in the Care Plan section)  Acute Rehab PT Goals Patient Stated Goal: to return to [facility] for continued rehab PT Goal Formulation: With patient/family Time For Goal Achievement: 03/14/19 Potential to Achieve Goals: Good    Frequency Min 2X/week   Barriers to  discharge        Co-evaluation               AM-PAC PT "6 Clicks" Mobility  Outcome Measure Help needed turning from your back to your side while in a flat bed without using bedrails?: A Lot Help needed moving from lying on your back to sitting on the side of a flat bed without using bedrails?: A Lot Help needed moving to and from a bed to a chair (including a wheelchair)?: A Lot Help needed standing up from a chair using your arms (e.g., wheelchair or bedside chair)?: A Little Help needed to walk in hospital room?: A Lot Help needed climbing 3-5 steps with a railing? : Total 6 Click Score: 12    End of Session Equipment Utilized During Treatment: Gait belt Activity Tolerance: Patient limited by fatigue Patient left: in bed;with family/visitor present;with call bell/phone within reach Nurse Communication: Mobility status PT Visit Diagnosis: Unsteadiness on feet (R26.81);Muscle weakness (generalized) (M62.81);Difficulty in walking, not elsewhere classified (R26.2);History of falling (Z91.81)    Time: 1225-1240 PT Time Calculation (min) (ACUTE ONLY): 15 min   Charges:   PT Evaluation $PT Eval Moderate Complexity: 1 Mod          Tyasia Packard, PT, GCS 03/10/19,12:52 PM

## 2019-03-10 NOTE — TOC Initial Note (Signed)
Transition of Care [facility]) - Initial/Assessment Note    Patient Details  Name: Tyler Petersen MRN: [id number] Date of Birth: Nov 25, 1928  Transition of [facility]) CM/SW Contact:    Eduard Roux, LCSWA Phone Number: 03/10/2019, 3:41 PM  Clinical Narrative:                  CSW visit with the patient and his daughter, Tyler Asp, at bedside. Patient was alert and oriented. CSW spoke with patient and his daughter aout retuning to ST rehab at [facility] in [place]. Patient and family agreed to discharge plan to return to SNF.   Patient and his family is realistic regarding therapy needs and expressed being hopeful to return to [facility]. Family expressed understanding of CSW role and discharge process as well as medical condition. No questions/concerns about plan or treatment at this time.  CSW contacted and updated SNF on patient's discharge plan.  Antony Blackbird, MSW, [facility] Clinical Social Worker [phone]    Expected Discharge Plan: Skilled Nursing Facility Barriers to Discharge: Continued Medical Work up   Patient Goals and CMS Choice Patient states their goals for this hospitalization and ongoing recovery are:: family wants patient to return to SNF      Expected Discharge Plan and Services Expected Discharge Plan: Skilled Nursing Facility         Expected Discharge Date: 03/10/19                                    Prior Living Arrangements/Services     Patient language and need for interpreter reviewed:: Yes Do you feel safe going back to the place where you live?: No(patient is returning to SNF)      Need for Family Participation in Patient Care: Yes (Comment) Care giver support system in place?: Yes (comment)   Criminal Activity/Legal Involvement Pertinent to Current Situation/Hospitalization: No - Comment as needed  Activities of Daily Living Home Assistive Devices/Equipment: Wheelchair ADL Screening (condition at time of admission) Patient's  cognitive ability adequate to safely complete daily activities?: Yes Is the patient deaf or have difficulty hearing?: No Does the patient have difficulty seeing, even when wearing glasses/contacts?: No Does the patient have difficulty concentrating, remembering, or making decisions?: No Patient able to express need for assistance with ADLs?: Yes Does the patient have difficulty dressing or bathing?: Yes Independently performs ADLs?: No Communication: Independent Dressing (OT): Needs assistance Is this a change from baseline?: Change from baseline, expected to last <3days Grooming: Needs assistance Is this a change from baseline?: Change from baseline, expected to last <3 days Feeding: Independent Toileting: Needs assistance Is this a change from baseline?: Change from baseline, expected to last <3 days In/Out Bed: Dependent Is this a change from baseline?: Pre-admission baseline Walks in Home: Dependent Is this a change from baseline?: Pre-admission baseline Does the patient have difficulty walking or climbing stairs?: Yes Weakness of Legs: Both Weakness of Arms/Hands: Both  Permission Sought/Granted Permission sought to share information with : Family Supports Permission granted to share information with : Yes, Verbal Permission Granted  Share Information with NAME: Tyler Petersen  and Tyler Petersen  Permission granted to share info w AGENCY: [facility]  Permission granted to share info w Relationship: son and daughter  Permission granted to share info w Contact Information: Casimiro Needle [phone] and Petersen [phone]  Emotional Assessment Appearance:: Appears stated age Attitude/Demeanor/Rapport: Engaged Affect (typically observed):  Accepting, Pleasant, Appropriate Orientation: : Oriented to Self, Oriented to Place, Oriented to  Time, Oriented to Situation Alcohol / Substance Use: Not Applicable Psych Involvement: No (comment)  Admission diagnosis:  GI  bleed [K92.2] Gastrointestinal hemorrhage, unspecified gastrointestinal hemorrhage type [K92.2] Anemia, unspecified type [D64.9] Patient Active Problem List   Diagnosis Date Noted  . NSTEMI (non-ST elevated myocardial infarction) ([place])   . Acute blood loss anemia   . Osteoarthritis   . Heme positive stool   . Symptomatic anemia 03/07/2019  . GI bleed 03/07/2019  . Anxiety 03/07/2019  . BPH (benign prostatic hyperplasia) 03/07/2019  . Respiratory failure with hypoxia ([place]) 03/07/2019  . Hyponatremia 03/07/2019  . Leukocytosis 03/07/2019   PCP:  Maryella Shivers, MD Pharmacy:   [place] [place] [address] Phone: [phone] Fax: [phone]     Social Determinants of Health (SDOH) Interventions    Readmission Risk Interventions No flowsheet data found.

## 2019-03-10 NOTE — Discharge Summary (Addendum)
Physician Discharge Summary  Tyler Petersen DGU:[id number] DOB: 1928-11-29 DOA: 03/07/2019  PCP: Maryella Shivers, MD  Admit date: 03/07/2019 Discharge date: 03/10/2019  Recommendations for Outpatient Follow-up:  1. Follow up with PCP in 7-10 days. 2. Have CBC checked in one week.   Discharge Diagnoses: Principal diagnosis is #1 1. Acute blood loss anemia 2. GI bleed 3. Leukocytosis due to steroids 4. Hyperglycemia due to steroids 5. Hyponatremia  Discharge Condition: Fair Disposition: SNF  Diet recommendation: Regular diet   Filed Weights   03/08/19 1150 03/09/19 0516 03/10/19 0444  Weight: 81.6 kg 81.2 kg 80.7 kg    History of present illness:  Tyler Petersen is a 85 y.o. male with medical history significant of hypertension, BPH, hyponatremia, osteoarthritis, and anxiety; who presents with complaints of dark stools over Tyler last "couple days".  Petersen had just recently been hospitalized at [facility] from 7/8-7/10 after having a fall.  Diagnosed with traumatic rhabdomyolysis with CK elevated up to 12,017, treated for urinary tract infection, and evaluated by cardiology to have a type II MI with troponin peaking at 1.69.  Petersen was with IV fluids and heparin drip.  MRI of his brain was obtained and noted to be negative during his hospitalization for reports of slurred speech.   Petersen reports that he has a speech impediment.  Cardiology evaluated Tyler Petersen.  Echocardiogram showed EF of 55 to 95% with diastolic filling pattern is consistent with impaired relaxation.  Heparin drip was discontinued and he was recommended to continue daily aspirin.  He had been living at home alone prior to that hospitalization, and ultimately was sent [facility].    Petersen is not on any blood thinners and takes a daily aspirin.  He reports that at rehab his blood pressures have been low recently and his oxygenation has been fluctuating.  He states  that stools have been dark color, but cannot quantify exactly how long.  Denies having any abdominal pain, nausea, vomiting, or dysuria symptoms.  His last colonoscopy was performed in [place], [place] approximately 8 years ago.  It was negative for any acute abnormalities to his knowledge.  Hospital Course: Tyler Petersen a 85 y.o.malehere with GI bleed and symptomatic anemia. S/p EGD: Erythema in body of stomach, but no visible bleeding. Colonoscopy was performed on 03/09/2019. It demonstrated diverticulosis, but again no source of bleeding. Tyler Petersen's hemoglobin has been stable. Hemoglobin on Tyler date of discharge was 9.8. Prednisone will be tapered as it may have played a role in Tyler Petersen's GI bleed. COVID-19 was rechecked and was negative.  Today's assessment: S: Tyler Petersen is resting comfortably. No new complaints. O: Vitals:  Vitals:   03/10/19 0444 03/10/19 0917  BP: 134/64 (!) 124/58  Pulse: 84 89  Resp: 18 20  Temp: 97.6 F (36.4 C) (!) 97.4 F (36.3 C)  SpO2: 92% 98%    Constitutional:   Tyler Petersen is awake, alert, and oriented x 3.  Respiratory:   No increased work of breathing.  No wheezes, rales, or rhonchi.  No tactile fremitus Cardiovascular:   Regular rate and rhythm.  No murmurs, ectopy, or gallups.  No lateral PMI. No thrills. Abdomen:   Abdomen is soft, non-tender, non-distended.  No hernias, masses, or organomegaly  Normoactive bowel sounds.  Musculoskeletal:   No cyanosis, clubbing or edema Skin:   No rashes, lesions, ulcers  palpation of skin: no induration or nodules Neurologic:   CN 2-12 intact  Sensation all 4 extremities intact Psychiatric:   judgement and insight appear normal  Mental status o Mood, affect appropriate o Orientation to person, place, time   Discharge Instructions  Discharge Instructions    Activity as tolerated - No restrictions   Complete by: As directed    Call MD for:   difficulty breathing, headache or visual disturbances   Complete by: As directed    Diet - low sodium heart healthy   Complete by: As directed    Increase activity slowly   Complete by: As directed      Allergies as of 03/10/2019      Reactions   Influenza Vaccines    Unknown      Medication List    TAKE these medications   acetaminophen 325 MG tablet Commonly known as: TYLENOL Take 650 mg by mouth every 6 (six) hours as needed.   amLODipine 10 MG tablet Commonly known as: NORVASC Take 10 mg by mouth every Monday, Wednesday, and Friday.   aspirin EC 81 MG tablet Take 81 mg by mouth daily.   bisacodyl 10 MG suppository Commonly known as: DULCOLAX Place 10 mg rectally as needed for mild constipation, moderate constipation or severe constipation.   finasteride 5 MG tablet Commonly known as: PROSCAR Take 5 mg by mouth daily.   FLUoxetine 10 MG capsule Commonly known as: PROZAC Take 10 mg by mouth daily.   losartan 50 MG tablet Commonly known as: COZAAR Take 50 mg by mouth daily.   magnesium hydroxide 400 MG/5ML suspension Commonly known as: MILK OF MAGNESIA Take 30 mLs by mouth daily as needed for mild constipation or moderate constipation.   OXYGEN Inhale 2 L into Tyler lungs continuous.   pantoprazole 40 MG tablet Commonly known as: PROTONIX Take 40 mg by mouth daily.   predniSONE 20 MG tablet Commonly known as: DELTASONE Take 1 tablet (20 mg total) by mouth daily with breakfast. Start taking on: March 11, 2019 What changed:   medication strength  how much to take  when to take this  additional instructions   tamsulosin 0.4 MG Caps capsule Commonly known as: FLOMAX Take 0.4 mg by mouth.   traZODone 50 MG tablet Commonly known as: DESYREL Take 50 mg by mouth at bedtime.     Prednisone will be tapered. Allergies  Allergen Reactions   Influenza Vaccines     Unknown    Tyler results of significant diagnostics from this hospitalization  (including imaging, microbiology, ancillary and laboratory) are listed below for reference.    Significant Diagnostic Studies: Dg Chest Port 1 View  Result Date: 03/07/2019 CLINICAL DATA:  GI bleed EXAM: PORTABLE CHEST 1 VIEW COMPARISON:  02/09/2019 FINDINGS: Low lung volumes. Mild airspace disease at Tyler left base. Elevated right diaphragm with colonic inter positioning beneath Tyler right diaphragm. Stable cardiomediastinal silhouette. No pneumothorax. IMPRESSION: 1. Low lung volumes with atelectasis or mild infiltrate at Tyler left base. Electronically Signed   By: Jasmine PangKim  Fujinaga M.D.   On: 03/07/2019 18:23    Microbiology: Recent Results (from Tyler past 240 hour(s))  SARS Coronavirus 2 [facility](Hospital order, Performed [facility] lab) Nasopharyngeal Nasopharyngeal Swab     Status: None   Collection Time: 03/07/19  4:15 PM   Specimen: Nasopharyngeal Swab  Result Value Ref Range Status   SARS Coronavirus 2 NEGATIVE NEGATIVE Final    Comment: (NOTE) If result is NEGATIVE SARS-CoV-2 target nucleic acids are NOT DETECTED. Tyler SARS-CoV-2 RNA is generally detectable in upper and  lower  respiratory specimens during Tyler acute phase of infection. Tyler lowest  concentration of SARS-CoV-2 viral copies this assay can detect is 250  copies / mL. A negative result does not preclude SARS-CoV-2 infection  and should not be used as Tyler sole basis for treatment or other  Petersen management decisions.  A negative result may occur with  improper specimen collection / handling, submission of specimen other  than nasopharyngeal swab, presence of viral mutation(s) within Tyler  areas targeted by this assay, and inadequate number of viral copies  (<250 copies / mL). A negative result must be combined with clinical  observations, Petersen history, and epidemiological information. If result is POSITIVE SARS-CoV-2 target nucleic acids are DETECTED. Tyler SARS-CoV-2 RNA is generally detectable in upper and lower    respiratory specimens dur ing Tyler acute phase of infection.  Positive  results are indicative of active infection with SARS-CoV-2.  Clinical  correlation with Petersen history and other diagnostic information is  necessary to determine Petersen infection status.  Positive results do  not rule out bacterial infection or co-infection with other viruses. If result is PRESUMPTIVE POSTIVE SARS-CoV-2 nucleic acids MAY BE PRESENT.   A presumptive positive result was obtained on Tyler submitted specimen  and confirmed on repeat testing.  While 2019 novel coronavirus  (SARS-CoV-2) nucleic acids may be present in Tyler submitted sample  additional confirmatory testing may be necessary for epidemiological  and / or clinical management purposes  to differentiate between  SARS-CoV-2 and other Sarbecovirus currently known to infect humans.  If clinically indicated additional testing with an alternate test  methodology [phone]) is advised. Tyler SARS-CoV-2 RNA is generally  detectable in upper and lower respiratory sp ecimens during Tyler acute  phase of infection. Tyler expected result is Negative. Fact Sheet for Patients:  BoilerBrush.com.cy Fact Sheet for Healthcare Providers: https://pope.com/ This test is not yet approved or cleared by [place] FDA and has been authorized for detection and/or diagnosis of SARS-CoV-2 by FDA under an Emergency Use Authorization (EUA).  This EUA will remain in effect (meaning this test can be used) for Tyler duration of Tyler COVID-19 declaration under Section 564(b)(1) of Tyler Act, 21 U.S.C. section 360bbb-3(b)(1), unless Tyler authorization is terminated or revoked sooner. Performed at [facility], [address], [place], [place] [address]   MRSA PCR Screening     Status: None   Collection Time: 03/08/19  8:38 PM   Specimen: Nasal Mucosa; Nasopharyngeal  Result Value Ref Range Status   MRSA by PCR NEGATIVE  NEGATIVE Final    Comment:        Tyler GeneXpert MRSA Assay (FDA approved for NASAL specimens only), is one component of a comprehensive MRSA colonization surveillance program. It is not intended to diagnose MRSA infection nor to guide or monitor treatment for MRSA infections. Performed at [facility], [address], [place], [place] [address]      Labs: Basic Metabolic Panel: Recent Labs  Lab 03/07/19 1510 03/08/19 0042 03/09/19 0625  NA 127* 132* 136  K 5.0 4.7 3.9  CL 94* 99 101  CO2 24 27 27   GLUCOSE 157* 99 96  BUN 19 11 5*  CREATININE 0.73 0.56* 0.53*  CALCIUM 8.0* 8.0* 7.8*   Liver Function Tests: Recent Labs  Lab 03/07/19 1510  AST 22  ALT 27  ALKPHOS 39  BILITOT 0.5  PROT 4.6*  ALBUMIN 2.7*   No results for input(s): LIPASE, AMYLASE in Tyler last  168 hours. No results for input(s): AMMONIA in Tyler last 168 hours. CBC: Recent Labs  Lab 03/07/19 1510 03/08/19 0042 03/09/19 0625 03/10/19 0957  WBC 20.7* 17.1* 12.6*  --   HGB 5.8* 8.6* 9.6* 9.8*  HCT 18.5* 25.4* 28.8* 30.1*  MCV 99.5 93.0 95.0  --   PLT 291 202 221  --    Cardiac Enzymes: No results for input(s): CKTOTAL, CKMB, CKMBINDEX, TROPONINI in Tyler last 168 hours. BNP: BNP (last 3 results) No results for input(s): BNP in Tyler last 8760 hours.  ProBNP (last 3 results) No results for input(s): PROBNP in Tyler last 8760 hours.  CBG: No results for input(s): GLUCAP in Tyler last 168 hours.  Principal Problem:   Respiratory failure with hypoxia (HCC) Active Problems:   Symptomatic anemia   GI bleed   Anxiety   BPH (benign prostatic hyperplasia)   Hyponatremia   Leukocytosis   Osteoarthritis   Heme positive stool   Time coordinating discharge: 38 minutes.  Signed:        Rabab Currington, DO [facility]  03/11/2019, 2:19 pm

## 2019-03-10 NOTE — Progress Notes (Deleted)
Went to give meds, pt was asleep with 02 off, vitals checked, dinamap read 30's , checked monitor and was afib SVR 30-40, pt had 02 off and sat 84%, pt repositioned 02 3l/lnc applied, hr 50-60's when left, paged Dr Benny Lennert in regards to HR

## 2019-03-11 DIAGNOSIS — G729 Myopathy, unspecified: Secondary | ICD-10-CM | POA: Diagnosis not present

## 2019-03-11 DIAGNOSIS — R58 Hemorrhage, not elsewhere classified: Secondary | ICD-10-CM | POA: Diagnosis not present

## 2019-03-11 DIAGNOSIS — Z9181 History of falling: Secondary | ICD-10-CM | POA: Diagnosis not present

## 2019-03-11 DIAGNOSIS — M1991 Primary osteoarthritis, unspecified site: Secondary | ICD-10-CM | POA: Diagnosis not present

## 2019-03-11 DIAGNOSIS — R531 Weakness: Secondary | ICD-10-CM | POA: Diagnosis not present

## 2019-03-11 DIAGNOSIS — E46 Unspecified protein-calorie malnutrition: Secondary | ICD-10-CM | POA: Diagnosis not present

## 2019-03-11 DIAGNOSIS — R739 Hyperglycemia, unspecified: Secondary | ICD-10-CM | POA: Diagnosis not present

## 2019-03-11 DIAGNOSIS — J9600 Acute respiratory failure, unspecified whether with hypoxia or hypercapnia: Secondary | ICD-10-CM | POA: Diagnosis not present

## 2019-03-11 DIAGNOSIS — D62 Acute posthemorrhagic anemia: Secondary | ICD-10-CM | POA: Diagnosis not present

## 2019-03-11 DIAGNOSIS — E871 Hypo-osmolality and hyponatremia: Secondary | ICD-10-CM | POA: Diagnosis not present

## 2019-03-11 DIAGNOSIS — M6289 Other specified disorders of muscle: Secondary | ICD-10-CM | POA: Diagnosis not present

## 2019-03-11 DIAGNOSIS — F419 Anxiety disorder, unspecified: Secondary | ICD-10-CM | POA: Diagnosis not present

## 2019-03-11 DIAGNOSIS — G7249 Other inflammatory and immune myopathies, not elsewhere classified: Secondary | ICD-10-CM | POA: Diagnosis not present

## 2019-03-11 DIAGNOSIS — J9691 Respiratory failure, unspecified with hypoxia: Secondary | ICD-10-CM | POA: Diagnosis not present

## 2019-03-11 DIAGNOSIS — R4182 Altered mental status, unspecified: Secondary | ICD-10-CM | POA: Diagnosis not present

## 2019-03-11 DIAGNOSIS — R262 Difficulty in walking, not elsewhere classified: Secondary | ICD-10-CM | POA: Diagnosis not present

## 2019-03-11 DIAGNOSIS — I10 Essential (primary) hypertension: Secondary | ICD-10-CM | POA: Diagnosis not present

## 2019-03-11 DIAGNOSIS — Z20828 Contact with and (suspected) exposure to other viral communicable diseases: Secondary | ICD-10-CM | POA: Diagnosis not present

## 2019-03-11 DIAGNOSIS — R279 Unspecified lack of coordination: Secondary | ICD-10-CM | POA: Diagnosis not present

## 2019-03-11 DIAGNOSIS — I2510 Atherosclerotic heart disease of native coronary artery without angina pectoris: Secondary | ICD-10-CM | POA: Diagnosis not present

## 2019-03-11 DIAGNOSIS — R748 Abnormal levels of other serum enzymes: Secondary | ICD-10-CM | POA: Diagnosis not present

## 2019-03-11 DIAGNOSIS — R195 Other fecal abnormalities: Secondary | ICD-10-CM | POA: Diagnosis not present

## 2019-03-11 DIAGNOSIS — R2681 Unsteadiness on feet: Secondary | ICD-10-CM | POA: Diagnosis not present

## 2019-03-11 DIAGNOSIS — D649 Anemia, unspecified: Secondary | ICD-10-CM | POA: Diagnosis not present

## 2019-03-11 DIAGNOSIS — M1990 Unspecified osteoarthritis, unspecified site: Secondary | ICD-10-CM | POA: Diagnosis not present

## 2019-03-11 DIAGNOSIS — K922 Gastrointestinal hemorrhage, unspecified: Secondary | ICD-10-CM | POA: Diagnosis not present

## 2019-03-11 DIAGNOSIS — T796XXA Traumatic ischemia of muscle, initial encounter: Secondary | ICD-10-CM | POA: Diagnosis not present

## 2019-03-11 DIAGNOSIS — Z743 Need for continuous supervision: Secondary | ICD-10-CM | POA: Diagnosis not present

## 2019-03-11 DIAGNOSIS — F330 Major depressive disorder, recurrent, mild: Secondary | ICD-10-CM | POA: Diagnosis not present

## 2019-03-11 DIAGNOSIS — D72829 Elevated white blood cell count, unspecified: Secondary | ICD-10-CM | POA: Diagnosis not present

## 2019-03-11 DIAGNOSIS — N400 Enlarged prostate without lower urinary tract symptoms: Secondary | ICD-10-CM | POA: Diagnosis not present

## 2019-03-11 NOTE — Evaluation (Signed)
Occupational Therapy Evaluation Patient Details Name: Tyler Petersen MRN: [id number] DOB: 03/30/1929 Today's Date: 03/11/2019    History of Present Illness PAtient is an 85 year old male admitted from [facility] (there for rehab) due to symptomatic anemia, dark stools, low BP. PMH to include fall, anxiety, OA.   Clinical Impression   Pt with decline in function and safety with ADLs and ADL mobility. Pt admitted form SNF where he was participating in [facility] rehab and will return there to resume rehab after acute D/C. Pt at min guard A - mod/max A with UB and LB ADLs respectively, min A with mobility using RW and as impaired strength, balance, coordination and endurance. All education completed and no further acute OT is indicated at this time. Will defer continued/further OT intervention to the SNF    Follow Up Recommendations  SNF    Equipment Recommendations  Other (comment)(TBD at SNF)    Recommendations for Other Services       Precautions / Restrictions Precautions Precautions: Fall Restrictions Weight Bearing Restrictions: No      Mobility Bed Mobility Overal bed mobility: Needs Assistance Bed Mobility: Sit to Supine;Supine to Sit     Supine to sit: Min guard     General bed mobility comments: required assistance to straighten out in bed  Transfers Overall transfer level: Needs assistance Equipment used: Rolling walker (2 wheeled) Transfers: Sit to/from Stand Sit to Stand: Min assist              Balance Overall balance assessment: Needs assistance Sitting-balance support: No upper extremity supported;Feet supported Sitting balance-Leahy Scale: Good     Standing balance support: Bilateral upper extremity supported Standing balance-Leahy Scale: Poor                             ADL either performed or assessed with clinical judgement   ADL Overall ADL's : Needs assistance/impaired Eating/Feeding: Set up;Sitting   Grooming:  Wash/dry hands;Wash/dry face;Sitting;Min guard   Upper Body Bathing: Min guard;Sitting   Lower Body Bathing: Moderate assistance   Upper Body Dressing : Min guard;Sitting   Lower Body Dressing: Maximal assistance   Toilet Transfer: Minimal assistance   Toileting- Clothing Manipulation and Hygiene: Moderate assistance       Functional mobility during ADLs: Minimal assistance;Rolling walker       Vision Baseline Vision/History: Wears glasses Patient Visual Report: No change from baseline       Perception     Praxis      Pertinent Vitals/Pain Pain Assessment: No/denies pain     Hand Dominance Right   Extremity/Trunk Assessment Upper Extremity Assessment Upper Extremity Assessment: Generalized weakness   Lower Extremity Assessment Lower Extremity Assessment: Defer to PT evaluation   Cervical / Trunk Assessment Cervical / Trunk Assessment: Kyphotic   Communication Communication Communication: No difficulties;Other (comment)(minimally slurred speech)   Cognition Arousal/Alertness: Awake/alert Behavior During Therapy: WFL for tasks assessed/performed Overall Cognitive Status: Within Functional Limits for tasks assessed                                     General Comments       Exercises     Shoulder Instructions      Home Living Family/patient expects to be discharged to:: Skilled nursing facility Living Arrangements: Alone  Additional Comments: daughter checks on him daily      Prior Functioning/Environment Level of Independence: Independent        Comments: was independent prior to fall which led him to recent rehab stay, currently using walker for mobility        OT Problem List: Decreased strength;Decreased activity tolerance;Decreased coordination;Impaired balance (sitting and/or standing)      OT Treatment/Interventions:      OT Goals(Current goals can be found in the care plan  section) Acute Rehab OT Goals Patient Stated Goal: to return to [facility] for continued rehab OT Goal Formulation: With patient/family  OT Frequency:     Barriers to D/C: Decreased caregiver support  pt to retunr to SNF to resume ST rehab       Co-evaluation              AM-PAC OT "6 Clicks" Daily Activity     Outcome Measure Help from another person eating meals?: A Little Help from another person taking care of personal grooming?: A Little Help from another person toileting, which includes using toliet, bedpan, or urinal?: A Lot Help from another person bathing (including washing, rinsing, drying)?: A Lot Help from another person to put on and taking off regular upper body clothing?: A Little Help from another person to put on and taking off regular lower body clothing?: A Lot 6 Click Score: 15   End of Session Equipment Utilized During Treatment: Gait belt;Rolling walker  Activity Tolerance: Patient tolerated treatment well Patient left: in bed;with call bell/phone within reach;with family/visitor present  OT Visit Diagnosis: Unsteadiness on feet (R26.81);Other abnormalities of gait and mobility (R26.89);Muscle weakness (generalized) (M62.81);History of falling (Z91.81)                Time: 1119-1150 OT Time Calculation (min): 31 min Charges:  OT General Charges $OT Visit: 1 Visit OT Evaluation $OT Eval Moderate Complexity: 1 Mod    Galen Manila 03/11/2019, 12:22 PM

## 2019-03-11 NOTE — Plan of Care (Signed)

## 2019-03-11 NOTE — Plan of Care (Signed)
Problem: Education: Goal: Knowledge of General Education information will improve Description: Including pain rating scale, medication(s)/side effects and non-pharmacologic comfort measures 03/11/2019 1539 by Arlina Robes, RN Outcome: Adequate for Discharge 03/11/2019 1103 by Arlina Robes, RN Outcome: Progressing 03/11/2019 1102 by Arlina Robes, RN Outcome: Progressing   Problem: Health Behavior/Discharge Planning: Goal: Ability to manage health-related needs will improve 03/11/2019 1539 by Arlina Robes, RN Outcome: Adequate for Discharge 03/11/2019 1103 by Arlina Robes, RN Outcome: Progressing 03/11/2019 1102 by Arlina Robes, RN Outcome: Progressing   Problem: Clinical Measurements: Goal: Ability to maintain clinical measurements within normal limits will improve 03/11/2019 1539 by Arlina Robes, RN Outcome: Adequate for Discharge 03/11/2019 1103 by Arlina Robes, RN Outcome: Progressing 03/11/2019 1102 by Arlina Robes, RN Outcome: Progressing Goal: Will remain free from infection 03/11/2019 1539 by Arlina Robes, RN Outcome: Adequate for Discharge 03/11/2019 1103 by Arlina Robes, RN Outcome: Progressing 03/11/2019 1102 by Arlina Robes, RN Outcome: Progressing Goal: Diagnostic test results will improve 03/11/2019 1539 by Arlina Robes, RN Outcome: Adequate for Discharge 03/11/2019 1103 by Arlina Robes, RN Outcome: Progressing 03/11/2019 1102 by Arlina Robes, RN Outcome: Progressing Goal: Respiratory complications will improve 03/11/2019 1539 by Arlina Robes, RN Outcome: Adequate for Discharge 03/11/2019 1103 by Arlina Robes, RN Outcome: Progressing 03/11/2019 1102 by Arlina Robes, RN Outcome: Progressing Goal: Cardiovascular complication will be avoided 03/11/2019 1539 by Arlina Robes, RN Outcome: Adequate for Discharge 03/11/2019 1103 by Arlina Robes, RN Outcome: Progressing 03/11/2019  1102 by Arlina Robes, RN Outcome: Progressing   Problem: Activity: Goal: Risk for activity intolerance will decrease 03/11/2019 1539 by Arlina Robes, RN Outcome: Adequate for Discharge 03/11/2019 1103 by Arlina Robes, RN Outcome: Progressing 03/11/2019 1102 by Arlina Robes, RN Outcome: Progressing   Problem: Nutrition: Goal: Adequate nutrition will be maintained 03/11/2019 1539 by Arlina Robes, RN Outcome: Adequate for Discharge 03/11/2019 1103 by Arlina Robes, RN Outcome: Progressing 03/11/2019 1102 by Arlina Robes, RN Outcome: Progressing   Problem: Coping: Goal: Level of anxiety will decrease 03/11/2019 1539 by Arlina Robes, RN Outcome: Adequate for Discharge 03/11/2019 1103 by Arlina Robes, RN Outcome: Progressing 03/11/2019 1102 by Arlina Robes, RN Outcome: Progressing   Problem: Elimination: Goal: Will not experience complications related to bowel motility 03/11/2019 1539 by Arlina Robes, RN Outcome: Adequate for Discharge 03/11/2019 1103 by Arlina Robes, RN Outcome: Progressing 03/11/2019 1102 by Arlina Robes, RN Outcome: Progressing Goal: Will not experience complications related to urinary retention 03/11/2019 1539 by Arlina Robes, RN Outcome: Adequate for Discharge 03/11/2019 1103 by Arlina Robes, RN Outcome: Progressing 03/11/2019 1102 by Arlina Robes, RN Outcome: Progressing   Problem: Pain Managment: Goal: General experience of comfort will improve 03/11/2019 1539 by Arlina Robes, RN Outcome: Adequate for Discharge 03/11/2019 1103 by Arlina Robes, RN Outcome: Progressing 03/11/2019 1102 by Arlina Robes, RN Outcome: Progressing   Problem: Safety: Goal: Ability to remain free from injury will improve 03/11/2019 1539 by Arlina Robes, RN Outcome: Adequate for Discharge 03/11/2019 1103 by Arlina Robes, RN Outcome: Progressing 03/11/2019 1102 by Arlina Robes,  RN Outcome: Progressing   Problem: Skin Integrity: Goal: Risk for impaired skin integrity will decrease 03/11/2019 1539 by Arlina Robes, RN Outcome: Adequate for Discharge 03/11/2019 1103 by Arlina Robes, RN Outcome: Progressing 03/11/2019 1102 by Arlina Robes, RN  Outcome: Progressing   

## 2019-03-11 NOTE — Progress Notes (Signed)
Report called to Alex,Rn of [facility] in [place] phone [phone] he verbalized understanding of report given. Social worker has called ambulance for transfer to facility await their arrival. Daughter was present today aware of his discharge to [facility] today. No further changes noted.

## 2019-03-11 NOTE — Progress Notes (Signed)
PROGRESS NOTE  Tyler Petersen NTI:[id number] DOB: November 06, 1928 DOA: 03/07/2019 PCP: Charlott Rakes, MD  Brief History   Tyler Petersen is a 85 y.o. male here with GI bleed and symptomatic anemia.  S/p EGD: was performed and demonstrated only erythematous mucosa in the stomach . Colonoscopy was performed on 03/10/2019 and demonstrated only diverticulosis. Plan to retest for COVID-19 in preparation for the patient's discharge to SNF. Consultants  . Gastroenterology  Procedures  . EGD  Antibiotics   Anti-infectives (From admission, onward)   None     Subjective  The patient is resting comfortably. No new complaints.  Objective   Vitals:  Vitals:   03/11/19 0920 03/11/19 1234  BP: (!) 129/94 134/63  Pulse: 99 100  Resp: (!) 22 18  Temp: (!) 97.5 F (36.4 C) 97.8 F (36.6 C)  SpO2: 94% (!) 86%    Exam:  Constitutional:  . The patient is awake and alert. He is in no acute distress. Respiratory:  . There is no increased work of breathing. . No wheezes, rales, or rhonchi. . Normoactive bowel sounds.  Cardiovascular:  . Regular rate and rhythm . No murmurs, ectopy, or gallups. . No lateral PMI. No thrills.  Abdomen:  . Abdomen is soft, non-tender, no-distended. . No hernias, masses, or organomegaly . Normoactive bowel sounds. Musculoskeletal:  . No cyanosis, clubbing, or edema Skin:  . No rashes, lesions, ulcers . palpation of skin: no induration or nodules Neurologic:  . CN 2-12 intact . Sensation all 4 extremities intact Psychiatric:  . Mental status o Mood, affect appropriate o Orientation to person  I have personally reviewed the following:   Today's Data  . Vitals, CBC, BMP  Other Data  . EGD, Erythema of stomach, but no evidence of acute bleeding in upper GI tract.  Scheduled Meds: . amLODipine  10 mg Oral Q M,W,F  . finasteride  5 mg Oral Daily  . FLUoxetine  10 mg Oral Daily  . ketoconazole  1 application Topical BID  . losartan  50  mg Oral Daily  . pantoprazole  40 mg Oral QAC breakfast  . predniSONE  20 mg Oral Q breakfast  . tamsulosin  0.4 mg Oral QPC breakfast  . traZODone  50 mg Oral QHS   Continuous Infusions:   Principal Problem:   Respiratory failure with hypoxia (HCC) Active Problems:   Symptomatic anemia   GI bleed   Anxiety   BPH (benign prostatic hyperplasia)   Hyponatremia   Leukocytosis   Osteoarthritis   Heme positive stool   LOS: 3 days   A & P  GI bleed/ symptomatic anemia: etiology unclear. Was started on steroids several days ago. S/P 2 units PRBC's. Hg. 8.6. The patient has been evaluated by GI, and EGD was performed on 03/08/2019. This demonstrated no putative source of bleeding. There was erythema on the stomach. Colonoscopy was performed on 03/10/2019 and demonstrated no source of bleeding. They plan to take the patient to colonoscopy today. Hemoglobin is stable and is 9.6 this morning compared to 8.6 yesterday. The patient is receiving Protonix 40 mg twice daily. ASA is held. Hemoglobin is being followed. Transfusion for symptomatic anemia or hemoglobin under 7.0. I appreciate gastroenterology's assistance.  Leukocytosis: Declining. WBC elevated 20.7 on admission and 12.6 this am. Continue to monitor. Possibly due to inflammation. Monitor. Recordsshow WBC elevated up to 27, but treated for urinary tract infection during admission at [facility] in July. WBC was noted to decreased down to 18  prior to discharge. Urinalysis unremarkable. Chest xray with low lung volumes with atelectasis or mild infiltrate at left base. He is afebrile and non-toxic appearing.  Hyponatremia: Acute on chronic. Sodium 127 on admission and 136 on 03/09/2019. Baseline sodium appears to be around 133. During previous hospitalization at [facility] Plan to dc NS infusion and monitor.  Respiratory failure with hypoxia: Resolving. Patient is currently saturating 100% on room air. Monitor. Will check ambulatory  SaO2 before discharge. Likely hypoxia is due to atelectasis. Incentive spirometry seems to have helped.  Osteoarthritis: Patient on prednisone. Possibly important in terms of gastritis seen on EGD.   Steroid-induced hyperglycemia: Blood glucose initially mildly elevated at 157. This am serum glucose 99.Last hemoglobin A1c 5.6 in7/2020. Monitor. Plan to taper steroids on discharge to reduce risk of upper GI bleed.  Anxiety: Patient has history of anxiety and has been on Prozac for quite some time. Continue Prozac.  Osteoarthritis: Patient on regimen of steroids currently. Start to wean the patient off of steroids.  BPH: Continue Proscar and Flomax.  I have seen and examined this patient myself. I have spent 30 minutes in his evaluation and care.  Code Status: full Family Communication: son Tyler Petersen on phone updated Disposition Plan: back to facility DVT Prophylaxis: SCD's  Tyler Cliett, DO [facility] Direct contact: see [email or web address]  7PM-7AM contact night coverage as above 03/10/2019, 3:40 PM  LOS: 1 day

## 2019-03-11 NOTE — Plan of Care (Signed)
  Problem: Education: Goal: Knowledge of General Education information will improve Description: Including pain rating scale, medication(s)/side effects and non-pharmacologic comfort measures 03/11/2019 1103 by Arlina Robes, RN Outcome: Progressing 03/11/2019 1102 by Arlina Robes, RN Outcome: Progressing   Problem: Health Behavior/Discharge Planning: Goal: Ability to manage health-related needs will improve 03/11/2019 1103 by Arlina Robes, RN Outcome: Progressing 03/11/2019 1102 by Arlina Robes, RN Outcome: Progressing   Problem: Clinical Measurements: Goal: Ability to maintain clinical measurements within normal limits will improve 03/11/2019 1103 by Arlina Robes, RN Outcome: Progressing 03/11/2019 1102 by Arlina Robes, RN Outcome: Progressing Goal: Will remain free from infection 03/11/2019 1103 by Arlina Robes, RN Outcome: Progressing 03/11/2019 1102 by Arlina Robes, RN Outcome: Progressing Goal: Diagnostic test results will improve 03/11/2019 1103 by Arlina Robes, RN Outcome: Progressing 03/11/2019 1102 by Arlina Robes, RN Outcome: Progressing Goal: Respiratory complications will improve 03/11/2019 1103 by Arlina Robes, RN Outcome: Progressing 03/11/2019 1102 by Arlina Robes, RN Outcome: Progressing Goal: Cardiovascular complication will be avoided 03/11/2019 1103 by Arlina Robes, RN Outcome: Progressing 03/11/2019 1102 by Arlina Robes, RN Outcome: Progressing   Problem: Activity: Goal: Risk for activity intolerance will decrease 03/11/2019 1103 by Arlina Robes, RN Outcome: Progressing 03/11/2019 1102 by Arlina Robes, RN Outcome: Progressing   Problem: Nutrition: Goal: Adequate nutrition will be maintained 03/11/2019 1103 by Arlina Robes, RN Outcome: Progressing 03/11/2019 1102 by Arlina Robes, RN Outcome: Progressing   Problem: Coping: Goal: Level of anxiety will decrease 03/11/2019  1103 by Arlina Robes, RN Outcome: Progressing 03/11/2019 1102 by Arlina Robes, RN Outcome: Progressing   Problem: Elimination: Goal: Will not experience complications related to bowel motility 03/11/2019 1103 by Arlina Robes, RN Outcome: Progressing 03/11/2019 1102 by Arlina Robes, RN Outcome: Progressing Goal: Will not experience complications related to urinary retention 03/11/2019 1103 by Arlina Robes, RN Outcome: Progressing 03/11/2019 1102 by Arlina Robes, RN Outcome: Progressing   Problem: Pain Managment: Goal: General experience of comfort will improve 03/11/2019 1103 by Arlina Robes, RN Outcome: Progressing 03/11/2019 1102 by Arlina Robes, RN Outcome: Progressing   Problem: Safety: Goal: Ability to remain free from injury will improve 03/11/2019 1103 by Arlina Robes, RN Outcome: Progressing 03/11/2019 1102 by Arlina Robes, RN Outcome: Progressing   Problem: Skin Integrity: Goal: Risk for impaired skin integrity will decrease 03/11/2019 1103 by Arlina Robes, RN Outcome: Progressing 03/11/2019 1102 by Arlina Robes, RN Outcome: Progressing

## 2019-03-14 DIAGNOSIS — R748 Abnormal levels of other serum enzymes: Secondary | ICD-10-CM | POA: Diagnosis not present

## 2019-03-14 DIAGNOSIS — G7249 Other inflammatory and immune myopathies, not elsewhere classified: Secondary | ICD-10-CM | POA: Diagnosis not present

## 2019-03-14 DIAGNOSIS — R262 Difficulty in walking, not elsewhere classified: Secondary | ICD-10-CM | POA: Diagnosis not present

## 2019-03-14 DIAGNOSIS — R531 Weakness: Secondary | ICD-10-CM | POA: Diagnosis not present

## 2019-03-14 DIAGNOSIS — Z9181 History of falling: Secondary | ICD-10-CM | POA: Diagnosis not present

## 2019-03-14 DIAGNOSIS — T796XXA Traumatic ischemia of muscle, initial encounter: Secondary | ICD-10-CM | POA: Diagnosis not present

## 2019-03-18 ENCOUNTER — Other Ambulatory Visit: Payer: Self-pay | Admitting: *Deleted

## 2019-03-18 DIAGNOSIS — R2681 Unsteadiness on feet: Secondary | ICD-10-CM | POA: Diagnosis not present

## 2019-03-18 DIAGNOSIS — D62 Acute posthemorrhagic anemia: Secondary | ICD-10-CM | POA: Diagnosis not present

## 2019-03-18 DIAGNOSIS — E46 Unspecified protein-calorie malnutrition: Secondary | ICD-10-CM | POA: Diagnosis not present

## 2019-03-18 DIAGNOSIS — F330 Major depressive disorder, recurrent, mild: Secondary | ICD-10-CM | POA: Diagnosis not present

## 2019-03-18 DIAGNOSIS — K922 Gastrointestinal hemorrhage, unspecified: Secondary | ICD-10-CM | POA: Diagnosis not present

## 2019-03-18 DIAGNOSIS — I2510 Atherosclerotic heart disease of native coronary artery without angina pectoris: Secondary | ICD-10-CM | POA: Diagnosis not present

## 2019-03-18 NOTE — Patient Outreach (Signed)
Member assessed for potential [facility] Care Management needs as a benefit of  [place] Medicare.  Member is currently receiving rehab therapy at [facility]F.  Update received from [place] after her  weekly telephonic IDT meeting with [place] facility staff.  Facility reports family's desired disposition plan is for home. However, family is open to ALF.  Will continue to follow for progression, disposition plan, and for potential [facility] Care Management needs while at SNF.  Will continue to collaborate with [facility] UM team and facility on member.   Marthenia Rolling, MSN-Ed, RN,BSN [place] Acute Care Coordinator [phone] [facility]) [phone]  (Toll free office)

## 2019-03-22 DIAGNOSIS — M6289 Other specified disorders of muscle: Secondary | ICD-10-CM | POA: Diagnosis not present

## 2019-03-22 DIAGNOSIS — R748 Abnormal levels of other serum enzymes: Secondary | ICD-10-CM | POA: Diagnosis not present

## 2019-03-22 DIAGNOSIS — G729 Myopathy, unspecified: Secondary | ICD-10-CM | POA: Diagnosis not present

## 2019-03-22 DIAGNOSIS — T796XXA Traumatic ischemia of muscle, initial encounter: Secondary | ICD-10-CM | POA: Diagnosis not present

## 2019-03-23 ENCOUNTER — Other Ambulatory Visit: Payer: Self-pay | Admitting: *Deleted

## 2019-03-23 NOTE — Patient Outreach (Signed)
Member assessed for potential [facility] Care Management needs as a benefit of  [place] Medicare.  Member is currently receiving rehab therapy at [facility].  Update received from [place]. States Mr. Costilla will remain at facility under private pay for 2 days and then will ultimately transition to [facility] on 03/28/19.  Will plan to sign off. No identifiable [facility] Care Management needs at this time.   Marthenia Rolling, MSN-Ed, RN,BSN [place] Acute Care Coordinator [phone] [facility]) [phone]  (Toll free office)

## 2019-03-25 DIAGNOSIS — Z20828 Contact with and (suspected) exposure to other viral communicable diseases: Secondary | ICD-10-CM | POA: Diagnosis not present

## 2019-03-30 DIAGNOSIS — Z7982 Long term (current) use of aspirin: Secondary | ICD-10-CM | POA: Diagnosis not present

## 2019-03-30 DIAGNOSIS — K5900 Constipation, unspecified: Secondary | ICD-10-CM | POA: Diagnosis not present

## 2019-03-30 DIAGNOSIS — Z20828 Contact with and (suspected) exposure to other viral communicable diseases: Secondary | ICD-10-CM | POA: Diagnosis not present

## 2019-03-30 DIAGNOSIS — Z79899 Other long term (current) drug therapy: Secondary | ICD-10-CM | POA: Diagnosis not present

## 2019-03-30 DIAGNOSIS — M6281 Muscle weakness (generalized): Secondary | ICD-10-CM | POA: Diagnosis not present

## 2019-03-30 DIAGNOSIS — I252 Old myocardial infarction: Secondary | ICD-10-CM | POA: Diagnosis not present

## 2019-03-30 DIAGNOSIS — R278 Other lack of coordination: Secondary | ICD-10-CM | POA: Diagnosis not present

## 2019-03-30 DIAGNOSIS — F329 Major depressive disorder, single episode, unspecified: Secondary | ICD-10-CM | POA: Diagnosis not present

## 2019-03-30 DIAGNOSIS — N400 Enlarged prostate without lower urinary tract symptoms: Secondary | ICD-10-CM | POA: Diagnosis not present

## 2019-03-30 DIAGNOSIS — R2681 Unsteadiness on feet: Secondary | ICD-10-CM | POA: Diagnosis not present

## 2019-03-30 DIAGNOSIS — D62 Acute posthemorrhagic anemia: Secondary | ICD-10-CM | POA: Diagnosis not present

## 2019-03-30 DIAGNOSIS — D72829 Elevated white blood cell count, unspecified: Secondary | ICD-10-CM | POA: Diagnosis not present

## 2019-03-30 DIAGNOSIS — R739 Hyperglycemia, unspecified: Secondary | ICD-10-CM | POA: Diagnosis not present

## 2019-03-30 DIAGNOSIS — F419 Anxiety disorder, unspecified: Secondary | ICD-10-CM | POA: Diagnosis not present

## 2019-03-30 DIAGNOSIS — Z7952 Long term (current) use of systemic steroids: Secondary | ICD-10-CM | POA: Diagnosis not present

## 2019-03-30 DIAGNOSIS — E871 Hypo-osmolality and hyponatremia: Secondary | ICD-10-CM | POA: Diagnosis not present

## 2019-03-30 DIAGNOSIS — Z9181 History of falling: Secondary | ICD-10-CM | POA: Diagnosis not present

## 2019-03-30 DIAGNOSIS — K219 Gastro-esophageal reflux disease without esophagitis: Secondary | ICD-10-CM | POA: Diagnosis not present

## 2019-03-30 DIAGNOSIS — M1990 Unspecified osteoarthritis, unspecified site: Secondary | ICD-10-CM | POA: Diagnosis not present

## 2019-03-30 DIAGNOSIS — I10 Essential (primary) hypertension: Secondary | ICD-10-CM | POA: Diagnosis not present

## 2019-03-30 DIAGNOSIS — R471 Dysarthria and anarthria: Secondary | ICD-10-CM | POA: Diagnosis not present

## 2019-03-30 DIAGNOSIS — K922 Gastrointestinal hemorrhage, unspecified: Secondary | ICD-10-CM | POA: Diagnosis not present

## 2019-03-30 DIAGNOSIS — R1312 Dysphagia, oropharyngeal phase: Secondary | ICD-10-CM | POA: Diagnosis not present

## 2019-03-31 DIAGNOSIS — R1312 Dysphagia, oropharyngeal phase: Secondary | ICD-10-CM | POA: Diagnosis not present

## 2019-03-31 DIAGNOSIS — K922 Gastrointestinal hemorrhage, unspecified: Secondary | ICD-10-CM | POA: Diagnosis not present

## 2019-03-31 DIAGNOSIS — F329 Major depressive disorder, single episode, unspecified: Secondary | ICD-10-CM | POA: Diagnosis not present

## 2019-03-31 DIAGNOSIS — E871 Hypo-osmolality and hyponatremia: Secondary | ICD-10-CM | POA: Diagnosis not present

## 2019-03-31 DIAGNOSIS — I10 Essential (primary) hypertension: Secondary | ICD-10-CM | POA: Diagnosis not present

## 2019-03-31 DIAGNOSIS — D62 Acute posthemorrhagic anemia: Secondary | ICD-10-CM | POA: Diagnosis not present

## 2019-04-05 DIAGNOSIS — F331 Major depressive disorder, recurrent, moderate: Secondary | ICD-10-CM | POA: Diagnosis not present

## 2019-04-05 DIAGNOSIS — R1310 Dysphagia, unspecified: Secondary | ICD-10-CM | POA: Diagnosis not present

## 2019-04-05 DIAGNOSIS — K922 Gastrointestinal hemorrhage, unspecified: Secondary | ICD-10-CM | POA: Diagnosis not present

## 2019-04-05 DIAGNOSIS — M1990 Unspecified osteoarthritis, unspecified site: Secondary | ICD-10-CM | POA: Diagnosis not present

## 2019-04-05 DIAGNOSIS — N400 Enlarged prostate without lower urinary tract symptoms: Secondary | ICD-10-CM | POA: Diagnosis not present

## 2019-04-05 DIAGNOSIS — E871 Hypo-osmolality and hyponatremia: Secondary | ICD-10-CM | POA: Diagnosis not present

## 2019-04-05 DIAGNOSIS — I10 Essential (primary) hypertension: Secondary | ICD-10-CM | POA: Diagnosis not present

## 2019-04-05 DIAGNOSIS — R1312 Dysphagia, oropharyngeal phase: Secondary | ICD-10-CM | POA: Diagnosis not present

## 2019-04-05 DIAGNOSIS — D62 Acute posthemorrhagic anemia: Secondary | ICD-10-CM | POA: Diagnosis not present

## 2019-04-05 DIAGNOSIS — F329 Major depressive disorder, single episode, unspecified: Secondary | ICD-10-CM | POA: Diagnosis not present

## 2019-04-07 DIAGNOSIS — I10 Essential (primary) hypertension: Secondary | ICD-10-CM | POA: Diagnosis not present

## 2019-04-07 DIAGNOSIS — D62 Acute posthemorrhagic anemia: Secondary | ICD-10-CM | POA: Diagnosis not present

## 2019-04-07 DIAGNOSIS — Z79899 Other long term (current) drug therapy: Secondary | ICD-10-CM | POA: Diagnosis not present

## 2019-04-07 DIAGNOSIS — K922 Gastrointestinal hemorrhage, unspecified: Secondary | ICD-10-CM | POA: Diagnosis not present

## 2019-04-07 DIAGNOSIS — E871 Hypo-osmolality and hyponatremia: Secondary | ICD-10-CM | POA: Diagnosis not present

## 2019-04-07 DIAGNOSIS — R1312 Dysphagia, oropharyngeal phase: Secondary | ICD-10-CM | POA: Diagnosis not present

## 2019-04-07 DIAGNOSIS — F329 Major depressive disorder, single episode, unspecified: Secondary | ICD-10-CM | POA: Diagnosis not present

## 2019-04-08 DIAGNOSIS — N390 Urinary tract infection, site not specified: Secondary | ICD-10-CM | POA: Diagnosis not present

## 2019-04-10 DIAGNOSIS — R05 Cough: Secondary | ICD-10-CM | POA: Diagnosis not present

## 2019-04-12 DIAGNOSIS — F329 Major depressive disorder, single episode, unspecified: Secondary | ICD-10-CM | POA: Diagnosis not present

## 2019-04-12 DIAGNOSIS — I10 Essential (primary) hypertension: Secondary | ICD-10-CM | POA: Diagnosis not present

## 2019-04-12 DIAGNOSIS — D62 Acute posthemorrhagic anemia: Secondary | ICD-10-CM | POA: Diagnosis not present

## 2019-04-12 DIAGNOSIS — N390 Urinary tract infection, site not specified: Secondary | ICD-10-CM | POA: Diagnosis not present

## 2019-04-12 DIAGNOSIS — R1312 Dysphagia, oropharyngeal phase: Secondary | ICD-10-CM | POA: Diagnosis not present

## 2019-04-12 DIAGNOSIS — K922 Gastrointestinal hemorrhage, unspecified: Secondary | ICD-10-CM | POA: Diagnosis not present

## 2019-04-12 DIAGNOSIS — J189 Pneumonia, unspecified organism: Secondary | ICD-10-CM | POA: Diagnosis not present

## 2019-04-12 DIAGNOSIS — E871 Hypo-osmolality and hyponatremia: Secondary | ICD-10-CM | POA: Diagnosis not present

## 2019-04-14 DIAGNOSIS — R1312 Dysphagia, oropharyngeal phase: Secondary | ICD-10-CM | POA: Diagnosis not present

## 2019-04-14 DIAGNOSIS — M79675 Pain in left toe(s): Secondary | ICD-10-CM | POA: Diagnosis not present

## 2019-04-14 DIAGNOSIS — E871 Hypo-osmolality and hyponatremia: Secondary | ICD-10-CM | POA: Diagnosis not present

## 2019-04-14 DIAGNOSIS — F329 Major depressive disorder, single episode, unspecified: Secondary | ICD-10-CM | POA: Diagnosis not present

## 2019-04-14 DIAGNOSIS — I739 Peripheral vascular disease, unspecified: Secondary | ICD-10-CM | POA: Diagnosis not present

## 2019-04-14 DIAGNOSIS — M2042 Other hammer toe(s) (acquired), left foot: Secondary | ICD-10-CM | POA: Diagnosis not present

## 2019-04-14 DIAGNOSIS — K922 Gastrointestinal hemorrhage, unspecified: Secondary | ICD-10-CM | POA: Diagnosis not present

## 2019-04-14 DIAGNOSIS — B351 Tinea unguium: Secondary | ICD-10-CM | POA: Diagnosis not present

## 2019-04-14 DIAGNOSIS — D62 Acute posthemorrhagic anemia: Secondary | ICD-10-CM | POA: Diagnosis not present

## 2019-04-14 DIAGNOSIS — I10 Essential (primary) hypertension: Secondary | ICD-10-CM | POA: Diagnosis not present

## 2019-04-15 DIAGNOSIS — R1312 Dysphagia, oropharyngeal phase: Secondary | ICD-10-CM | POA: Diagnosis not present

## 2019-04-15 DIAGNOSIS — I10 Essential (primary) hypertension: Secondary | ICD-10-CM | POA: Diagnosis not present

## 2019-04-15 DIAGNOSIS — F329 Major depressive disorder, single episode, unspecified: Secondary | ICD-10-CM | POA: Diagnosis not present

## 2019-04-15 DIAGNOSIS — K922 Gastrointestinal hemorrhage, unspecified: Secondary | ICD-10-CM | POA: Diagnosis not present

## 2019-04-15 DIAGNOSIS — E871 Hypo-osmolality and hyponatremia: Secondary | ICD-10-CM | POA: Diagnosis not present

## 2019-04-15 DIAGNOSIS — D62 Acute posthemorrhagic anemia: Secondary | ICD-10-CM | POA: Diagnosis not present

## 2019-04-18 DIAGNOSIS — R1312 Dysphagia, oropharyngeal phase: Secondary | ICD-10-CM | POA: Diagnosis not present

## 2019-04-18 DIAGNOSIS — E871 Hypo-osmolality and hyponatremia: Secondary | ICD-10-CM | POA: Diagnosis not present

## 2019-04-18 DIAGNOSIS — F329 Major depressive disorder, single episode, unspecified: Secondary | ICD-10-CM | POA: Diagnosis not present

## 2019-04-18 DIAGNOSIS — I10 Essential (primary) hypertension: Secondary | ICD-10-CM | POA: Diagnosis not present

## 2019-04-18 DIAGNOSIS — K922 Gastrointestinal hemorrhage, unspecified: Secondary | ICD-10-CM | POA: Diagnosis not present

## 2019-04-18 DIAGNOSIS — D62 Acute posthemorrhagic anemia: Secondary | ICD-10-CM | POA: Diagnosis not present

## 2019-04-20 DIAGNOSIS — I10 Essential (primary) hypertension: Secondary | ICD-10-CM | POA: Diagnosis not present

## 2019-04-20 DIAGNOSIS — K922 Gastrointestinal hemorrhage, unspecified: Secondary | ICD-10-CM | POA: Diagnosis not present

## 2019-04-20 DIAGNOSIS — R1312 Dysphagia, oropharyngeal phase: Secondary | ICD-10-CM | POA: Diagnosis not present

## 2019-04-20 DIAGNOSIS — F329 Major depressive disorder, single episode, unspecified: Secondary | ICD-10-CM | POA: Diagnosis not present

## 2019-04-20 DIAGNOSIS — E871 Hypo-osmolality and hyponatremia: Secondary | ICD-10-CM | POA: Diagnosis not present

## 2019-04-20 DIAGNOSIS — D62 Acute posthemorrhagic anemia: Secondary | ICD-10-CM | POA: Diagnosis not present

## 2019-04-25 DIAGNOSIS — F329 Major depressive disorder, single episode, unspecified: Secondary | ICD-10-CM | POA: Diagnosis not present

## 2019-04-25 DIAGNOSIS — R1312 Dysphagia, oropharyngeal phase: Secondary | ICD-10-CM | POA: Diagnosis not present

## 2019-04-25 DIAGNOSIS — K922 Gastrointestinal hemorrhage, unspecified: Secondary | ICD-10-CM | POA: Diagnosis not present

## 2019-04-25 DIAGNOSIS — D62 Acute posthemorrhagic anemia: Secondary | ICD-10-CM | POA: Diagnosis not present

## 2019-04-25 DIAGNOSIS — E871 Hypo-osmolality and hyponatremia: Secondary | ICD-10-CM | POA: Diagnosis not present

## 2019-04-25 DIAGNOSIS — I10 Essential (primary) hypertension: Secondary | ICD-10-CM | POA: Diagnosis not present

## 2019-04-26 DIAGNOSIS — R05 Cough: Secondary | ICD-10-CM | POA: Diagnosis not present

## 2019-04-26 DIAGNOSIS — F329 Major depressive disorder, single episode, unspecified: Secondary | ICD-10-CM | POA: Diagnosis not present

## 2019-04-26 DIAGNOSIS — D62 Acute posthemorrhagic anemia: Secondary | ICD-10-CM | POA: Diagnosis not present

## 2019-04-26 DIAGNOSIS — R471 Dysarthria and anarthria: Secondary | ICD-10-CM | POA: Diagnosis not present

## 2019-04-26 DIAGNOSIS — R1312 Dysphagia, oropharyngeal phase: Secondary | ICD-10-CM | POA: Diagnosis not present

## 2019-04-26 DIAGNOSIS — E871 Hypo-osmolality and hyponatremia: Secondary | ICD-10-CM | POA: Diagnosis not present

## 2019-04-26 DIAGNOSIS — I10 Essential (primary) hypertension: Secondary | ICD-10-CM | POA: Diagnosis not present

## 2019-04-26 DIAGNOSIS — K922 Gastrointestinal hemorrhage, unspecified: Secondary | ICD-10-CM | POA: Diagnosis not present

## 2019-04-28 DIAGNOSIS — F329 Major depressive disorder, single episode, unspecified: Secondary | ICD-10-CM | POA: Diagnosis not present

## 2019-04-28 DIAGNOSIS — K922 Gastrointestinal hemorrhage, unspecified: Secondary | ICD-10-CM | POA: Diagnosis not present

## 2019-04-28 DIAGNOSIS — E871 Hypo-osmolality and hyponatremia: Secondary | ICD-10-CM | POA: Diagnosis not present

## 2019-04-28 DIAGNOSIS — D62 Acute posthemorrhagic anemia: Secondary | ICD-10-CM | POA: Diagnosis not present

## 2019-04-28 DIAGNOSIS — J189 Pneumonia, unspecified organism: Secondary | ICD-10-CM | POA: Diagnosis not present

## 2019-04-29 DIAGNOSIS — I10 Essential (primary) hypertension: Secondary | ICD-10-CM | POA: Diagnosis not present

## 2019-04-29 DIAGNOSIS — D72829 Elevated white blood cell count, unspecified: Secondary | ICD-10-CM | POA: Diagnosis not present

## 2019-04-29 DIAGNOSIS — F419 Anxiety disorder, unspecified: Secondary | ICD-10-CM | POA: Diagnosis not present

## 2019-04-29 DIAGNOSIS — Z9181 History of falling: Secondary | ICD-10-CM | POA: Diagnosis not present

## 2019-04-29 DIAGNOSIS — R471 Dysarthria and anarthria: Secondary | ICD-10-CM | POA: Diagnosis not present

## 2019-04-29 DIAGNOSIS — R1312 Dysphagia, oropharyngeal phase: Secondary | ICD-10-CM | POA: Diagnosis not present

## 2019-04-29 DIAGNOSIS — Z7982 Long term (current) use of aspirin: Secondary | ICD-10-CM | POA: Diagnosis not present

## 2019-04-29 DIAGNOSIS — N400 Enlarged prostate without lower urinary tract symptoms: Secondary | ICD-10-CM | POA: Diagnosis not present

## 2019-04-29 DIAGNOSIS — K5900 Constipation, unspecified: Secondary | ICD-10-CM | POA: Diagnosis not present

## 2019-04-29 DIAGNOSIS — I252 Old myocardial infarction: Secondary | ICD-10-CM | POA: Diagnosis not present

## 2019-04-29 DIAGNOSIS — D62 Acute posthemorrhagic anemia: Secondary | ICD-10-CM | POA: Diagnosis not present

## 2019-04-29 DIAGNOSIS — R739 Hyperglycemia, unspecified: Secondary | ICD-10-CM | POA: Diagnosis not present

## 2019-04-29 DIAGNOSIS — M6281 Muscle weakness (generalized): Secondary | ICD-10-CM | POA: Diagnosis not present

## 2019-04-29 DIAGNOSIS — K219 Gastro-esophageal reflux disease without esophagitis: Secondary | ICD-10-CM | POA: Diagnosis not present

## 2019-04-29 DIAGNOSIS — K922 Gastrointestinal hemorrhage, unspecified: Secondary | ICD-10-CM | POA: Diagnosis not present

## 2019-04-29 DIAGNOSIS — M1990 Unspecified osteoarthritis, unspecified site: Secondary | ICD-10-CM | POA: Diagnosis not present

## 2019-04-29 DIAGNOSIS — E871 Hypo-osmolality and hyponatremia: Secondary | ICD-10-CM | POA: Diagnosis not present

## 2019-04-29 DIAGNOSIS — F329 Major depressive disorder, single episode, unspecified: Secondary | ICD-10-CM | POA: Diagnosis not present

## 2019-04-29 DIAGNOSIS — Z7952 Long term (current) use of systemic steroids: Secondary | ICD-10-CM | POA: Diagnosis not present

## 2019-04-29 DIAGNOSIS — R278 Other lack of coordination: Secondary | ICD-10-CM | POA: Diagnosis not present

## 2019-04-29 DIAGNOSIS — R2681 Unsteadiness on feet: Secondary | ICD-10-CM | POA: Diagnosis not present

## 2019-04-29 DIAGNOSIS — Z79899 Other long term (current) drug therapy: Secondary | ICD-10-CM | POA: Diagnosis not present

## 2019-05-03 DIAGNOSIS — D62 Acute posthemorrhagic anemia: Secondary | ICD-10-CM | POA: Diagnosis not present

## 2019-05-03 DIAGNOSIS — E871 Hypo-osmolality and hyponatremia: Secondary | ICD-10-CM | POA: Diagnosis not present

## 2019-05-03 DIAGNOSIS — R1312 Dysphagia, oropharyngeal phase: Secondary | ICD-10-CM | POA: Diagnosis not present

## 2019-05-03 DIAGNOSIS — I10 Essential (primary) hypertension: Secondary | ICD-10-CM | POA: Diagnosis not present

## 2019-05-03 DIAGNOSIS — K922 Gastrointestinal hemorrhage, unspecified: Secondary | ICD-10-CM | POA: Diagnosis not present

## 2019-05-03 DIAGNOSIS — F329 Major depressive disorder, single episode, unspecified: Secondary | ICD-10-CM | POA: Diagnosis not present

## 2019-05-09 DIAGNOSIS — E871 Hypo-osmolality and hyponatremia: Secondary | ICD-10-CM | POA: Diagnosis not present

## 2019-05-09 DIAGNOSIS — R1312 Dysphagia, oropharyngeal phase: Secondary | ICD-10-CM | POA: Diagnosis not present

## 2019-05-09 DIAGNOSIS — D62 Acute posthemorrhagic anemia: Secondary | ICD-10-CM | POA: Diagnosis not present

## 2019-05-09 DIAGNOSIS — I10 Essential (primary) hypertension: Secondary | ICD-10-CM | POA: Diagnosis not present

## 2019-05-09 DIAGNOSIS — F329 Major depressive disorder, single episode, unspecified: Secondary | ICD-10-CM | POA: Diagnosis not present

## 2019-05-09 DIAGNOSIS — K922 Gastrointestinal hemorrhage, unspecified: Secondary | ICD-10-CM | POA: Diagnosis not present

## 2019-05-10 DIAGNOSIS — M1990 Unspecified osteoarthritis, unspecified site: Secondary | ICD-10-CM | POA: Diagnosis not present

## 2019-05-10 DIAGNOSIS — N400 Enlarged prostate without lower urinary tract symptoms: Secondary | ICD-10-CM | POA: Diagnosis not present

## 2019-05-10 DIAGNOSIS — R471 Dysarthria and anarthria: Secondary | ICD-10-CM | POA: Diagnosis not present

## 2019-05-10 DIAGNOSIS — R1310 Dysphagia, unspecified: Secondary | ICD-10-CM | POA: Diagnosis not present

## 2019-05-17 DIAGNOSIS — K922 Gastrointestinal hemorrhage, unspecified: Secondary | ICD-10-CM | POA: Diagnosis not present

## 2019-05-17 DIAGNOSIS — R1312 Dysphagia, oropharyngeal phase: Secondary | ICD-10-CM | POA: Diagnosis not present

## 2019-05-17 DIAGNOSIS — D62 Acute posthemorrhagic anemia: Secondary | ICD-10-CM | POA: Diagnosis not present

## 2019-05-17 DIAGNOSIS — E871 Hypo-osmolality and hyponatremia: Secondary | ICD-10-CM | POA: Diagnosis not present

## 2019-05-17 DIAGNOSIS — I10 Essential (primary) hypertension: Secondary | ICD-10-CM | POA: Diagnosis not present

## 2019-05-17 DIAGNOSIS — F329 Major depressive disorder, single episode, unspecified: Secondary | ICD-10-CM | POA: Diagnosis not present

## 2019-05-24 DIAGNOSIS — F329 Major depressive disorder, single episode, unspecified: Secondary | ICD-10-CM | POA: Diagnosis not present

## 2019-05-24 DIAGNOSIS — D62 Acute posthemorrhagic anemia: Secondary | ICD-10-CM | POA: Diagnosis not present

## 2019-05-24 DIAGNOSIS — E871 Hypo-osmolality and hyponatremia: Secondary | ICD-10-CM | POA: Diagnosis not present

## 2019-05-24 DIAGNOSIS — K922 Gastrointestinal hemorrhage, unspecified: Secondary | ICD-10-CM | POA: Diagnosis not present

## 2019-05-24 DIAGNOSIS — R1312 Dysphagia, oropharyngeal phase: Secondary | ICD-10-CM | POA: Diagnosis not present

## 2019-05-24 DIAGNOSIS — I10 Essential (primary) hypertension: Secondary | ICD-10-CM | POA: Diagnosis not present

## 2019-05-29 DIAGNOSIS — M6281 Muscle weakness (generalized): Secondary | ICD-10-CM | POA: Diagnosis not present

## 2019-05-29 DIAGNOSIS — R739 Hyperglycemia, unspecified: Secondary | ICD-10-CM | POA: Diagnosis not present

## 2019-05-29 DIAGNOSIS — Z9181 History of falling: Secondary | ICD-10-CM | POA: Diagnosis not present

## 2019-05-29 DIAGNOSIS — Z7952 Long term (current) use of systemic steroids: Secondary | ICD-10-CM | POA: Diagnosis not present

## 2019-05-29 DIAGNOSIS — K219 Gastro-esophageal reflux disease without esophagitis: Secondary | ICD-10-CM | POA: Diagnosis not present

## 2019-05-29 DIAGNOSIS — I10 Essential (primary) hypertension: Secondary | ICD-10-CM | POA: Diagnosis not present

## 2019-05-29 DIAGNOSIS — R278 Other lack of coordination: Secondary | ICD-10-CM | POA: Diagnosis not present

## 2019-05-29 DIAGNOSIS — Z79899 Other long term (current) drug therapy: Secondary | ICD-10-CM | POA: Diagnosis not present

## 2019-05-29 DIAGNOSIS — R2681 Unsteadiness on feet: Secondary | ICD-10-CM | POA: Diagnosis not present

## 2019-05-29 DIAGNOSIS — Z7982 Long term (current) use of aspirin: Secondary | ICD-10-CM | POA: Diagnosis not present

## 2019-05-29 DIAGNOSIS — N400 Enlarged prostate without lower urinary tract symptoms: Secondary | ICD-10-CM | POA: Diagnosis not present

## 2019-05-29 DIAGNOSIS — R471 Dysarthria and anarthria: Secondary | ICD-10-CM | POA: Diagnosis not present

## 2019-05-29 DIAGNOSIS — F419 Anxiety disorder, unspecified: Secondary | ICD-10-CM | POA: Diagnosis not present

## 2019-05-29 DIAGNOSIS — R1312 Dysphagia, oropharyngeal phase: Secondary | ICD-10-CM | POA: Diagnosis not present

## 2019-05-29 DIAGNOSIS — E871 Hypo-osmolality and hyponatremia: Secondary | ICD-10-CM | POA: Diagnosis not present

## 2019-05-29 DIAGNOSIS — F329 Major depressive disorder, single episode, unspecified: Secondary | ICD-10-CM | POA: Diagnosis not present

## 2019-05-29 DIAGNOSIS — M1990 Unspecified osteoarthritis, unspecified site: Secondary | ICD-10-CM | POA: Diagnosis not present

## 2019-05-29 DIAGNOSIS — D72829 Elevated white blood cell count, unspecified: Secondary | ICD-10-CM | POA: Diagnosis not present

## 2019-05-29 DIAGNOSIS — I252 Old myocardial infarction: Secondary | ICD-10-CM | POA: Diagnosis not present

## 2019-05-29 DIAGNOSIS — K5900 Constipation, unspecified: Secondary | ICD-10-CM | POA: Diagnosis not present

## 2019-05-31 DIAGNOSIS — F329 Major depressive disorder, single episode, unspecified: Secondary | ICD-10-CM | POA: Diagnosis not present

## 2019-05-31 DIAGNOSIS — R1312 Dysphagia, oropharyngeal phase: Secondary | ICD-10-CM | POA: Diagnosis not present

## 2019-05-31 DIAGNOSIS — E871 Hypo-osmolality and hyponatremia: Secondary | ICD-10-CM | POA: Diagnosis not present

## 2019-05-31 DIAGNOSIS — K5900 Constipation, unspecified: Secondary | ICD-10-CM | POA: Diagnosis not present

## 2019-05-31 DIAGNOSIS — K219 Gastro-esophageal reflux disease without esophagitis: Secondary | ICD-10-CM | POA: Diagnosis not present

## 2019-05-31 DIAGNOSIS — I10 Essential (primary) hypertension: Secondary | ICD-10-CM | POA: Diagnosis not present

## 2019-06-01 DIAGNOSIS — E559 Vitamin D deficiency, unspecified: Secondary | ICD-10-CM | POA: Diagnosis not present

## 2019-06-01 DIAGNOSIS — E7849 Other hyperlipidemia: Secondary | ICD-10-CM | POA: Diagnosis not present

## 2019-06-01 DIAGNOSIS — E119 Type 2 diabetes mellitus without complications: Secondary | ICD-10-CM | POA: Diagnosis not present

## 2019-06-01 DIAGNOSIS — D518 Other vitamin B12 deficiency anemias: Secondary | ICD-10-CM | POA: Diagnosis not present

## 2019-06-01 DIAGNOSIS — E038 Other specified hypothyroidism: Secondary | ICD-10-CM | POA: Diagnosis not present

## 2019-06-01 DIAGNOSIS — Z79899 Other long term (current) drug therapy: Secondary | ICD-10-CM | POA: Diagnosis not present

## 2019-06-07 DIAGNOSIS — D508 Other iron deficiency anemias: Secondary | ICD-10-CM | POA: Diagnosis not present

## 2019-06-07 DIAGNOSIS — F329 Major depressive disorder, single episode, unspecified: Secondary | ICD-10-CM | POA: Diagnosis not present

## 2019-06-07 DIAGNOSIS — E871 Hypo-osmolality and hyponatremia: Secondary | ICD-10-CM | POA: Diagnosis not present

## 2019-06-07 DIAGNOSIS — R1312 Dysphagia, oropharyngeal phase: Secondary | ICD-10-CM | POA: Diagnosis not present

## 2019-06-07 DIAGNOSIS — K5900 Constipation, unspecified: Secondary | ICD-10-CM | POA: Diagnosis not present

## 2019-06-07 DIAGNOSIS — K219 Gastro-esophageal reflux disease without esophagitis: Secondary | ICD-10-CM | POA: Diagnosis not present

## 2019-06-07 DIAGNOSIS — N400 Enlarged prostate without lower urinary tract symptoms: Secondary | ICD-10-CM | POA: Diagnosis not present

## 2019-06-07 DIAGNOSIS — R471 Dysarthria and anarthria: Secondary | ICD-10-CM | POA: Diagnosis not present

## 2019-06-07 DIAGNOSIS — I10 Essential (primary) hypertension: Secondary | ICD-10-CM | POA: Diagnosis not present

## 2019-06-09 DIAGNOSIS — I10 Essential (primary) hypertension: Secondary | ICD-10-CM | POA: Diagnosis not present

## 2019-06-09 DIAGNOSIS — E782 Mixed hyperlipidemia: Secondary | ICD-10-CM | POA: Diagnosis not present

## 2019-06-09 DIAGNOSIS — K219 Gastro-esophageal reflux disease without esophagitis: Secondary | ICD-10-CM | POA: Diagnosis not present

## 2019-06-09 DIAGNOSIS — I739 Peripheral vascular disease, unspecified: Secondary | ICD-10-CM | POA: Diagnosis not present

## 2019-06-11 DIAGNOSIS — E871 Hypo-osmolality and hyponatremia: Secondary | ICD-10-CM | POA: Diagnosis not present

## 2019-06-11 DIAGNOSIS — F329 Major depressive disorder, single episode, unspecified: Secondary | ICD-10-CM | POA: Diagnosis not present

## 2019-06-11 DIAGNOSIS — I10 Essential (primary) hypertension: Secondary | ICD-10-CM | POA: Diagnosis not present

## 2019-06-11 DIAGNOSIS — R1312 Dysphagia, oropharyngeal phase: Secondary | ICD-10-CM | POA: Diagnosis not present

## 2019-06-13 DIAGNOSIS — K5900 Constipation, unspecified: Secondary | ICD-10-CM | POA: Diagnosis not present

## 2019-06-13 DIAGNOSIS — K219 Gastro-esophageal reflux disease without esophagitis: Secondary | ICD-10-CM | POA: Diagnosis not present

## 2019-06-13 DIAGNOSIS — I10 Essential (primary) hypertension: Secondary | ICD-10-CM | POA: Diagnosis not present

## 2019-06-13 DIAGNOSIS — R1312 Dysphagia, oropharyngeal phase: Secondary | ICD-10-CM | POA: Diagnosis not present

## 2019-06-13 DIAGNOSIS — F329 Major depressive disorder, single episode, unspecified: Secondary | ICD-10-CM | POA: Diagnosis not present

## 2019-06-13 DIAGNOSIS — E871 Hypo-osmolality and hyponatremia: Secondary | ICD-10-CM | POA: Diagnosis not present

## 2019-06-21 DIAGNOSIS — K5900 Constipation, unspecified: Secondary | ICD-10-CM | POA: Diagnosis not present

## 2019-06-21 DIAGNOSIS — R1312 Dysphagia, oropharyngeal phase: Secondary | ICD-10-CM | POA: Diagnosis not present

## 2019-06-21 DIAGNOSIS — I10 Essential (primary) hypertension: Secondary | ICD-10-CM | POA: Diagnosis not present

## 2019-06-21 DIAGNOSIS — E871 Hypo-osmolality and hyponatremia: Secondary | ICD-10-CM | POA: Diagnosis not present

## 2019-06-21 DIAGNOSIS — K219 Gastro-esophageal reflux disease without esophagitis: Secondary | ICD-10-CM | POA: Diagnosis not present

## 2019-06-21 DIAGNOSIS — F329 Major depressive disorder, single episode, unspecified: Secondary | ICD-10-CM | POA: Diagnosis not present

## 2019-06-27 DIAGNOSIS — E871 Hypo-osmolality and hyponatremia: Secondary | ICD-10-CM | POA: Diagnosis not present

## 2019-06-27 DIAGNOSIS — K5900 Constipation, unspecified: Secondary | ICD-10-CM | POA: Diagnosis not present

## 2019-06-27 DIAGNOSIS — K219 Gastro-esophageal reflux disease without esophagitis: Secondary | ICD-10-CM | POA: Diagnosis not present

## 2019-06-27 DIAGNOSIS — R1312 Dysphagia, oropharyngeal phase: Secondary | ICD-10-CM | POA: Diagnosis not present

## 2019-06-27 DIAGNOSIS — F329 Major depressive disorder, single episode, unspecified: Secondary | ICD-10-CM | POA: Diagnosis not present

## 2019-06-27 DIAGNOSIS — I10 Essential (primary) hypertension: Secondary | ICD-10-CM | POA: Diagnosis not present

## 2019-06-28 DIAGNOSIS — R278 Other lack of coordination: Secondary | ICD-10-CM | POA: Diagnosis not present

## 2019-06-28 DIAGNOSIS — K5900 Constipation, unspecified: Secondary | ICD-10-CM | POA: Diagnosis not present

## 2019-06-28 DIAGNOSIS — Z7952 Long term (current) use of systemic steroids: Secondary | ICD-10-CM | POA: Diagnosis not present

## 2019-06-28 DIAGNOSIS — E871 Hypo-osmolality and hyponatremia: Secondary | ICD-10-CM | POA: Diagnosis not present

## 2019-06-28 DIAGNOSIS — Z9181 History of falling: Secondary | ICD-10-CM | POA: Diagnosis not present

## 2019-06-28 DIAGNOSIS — D518 Other vitamin B12 deficiency anemias: Secondary | ICD-10-CM | POA: Diagnosis not present

## 2019-06-28 DIAGNOSIS — R2681 Unsteadiness on feet: Secondary | ICD-10-CM | POA: Diagnosis not present

## 2019-06-28 DIAGNOSIS — R471 Dysarthria and anarthria: Secondary | ICD-10-CM | POA: Diagnosis not present

## 2019-06-28 DIAGNOSIS — F419 Anxiety disorder, unspecified: Secondary | ICD-10-CM | POA: Diagnosis not present

## 2019-06-28 DIAGNOSIS — R1312 Dysphagia, oropharyngeal phase: Secondary | ICD-10-CM | POA: Diagnosis not present

## 2019-06-28 DIAGNOSIS — Z7982 Long term (current) use of aspirin: Secondary | ICD-10-CM | POA: Diagnosis not present

## 2019-06-28 DIAGNOSIS — I10 Essential (primary) hypertension: Secondary | ICD-10-CM | POA: Diagnosis not present

## 2019-06-28 DIAGNOSIS — D72829 Elevated white blood cell count, unspecified: Secondary | ICD-10-CM | POA: Diagnosis not present

## 2019-06-28 DIAGNOSIS — E038 Other specified hypothyroidism: Secondary | ICD-10-CM | POA: Diagnosis not present

## 2019-06-28 DIAGNOSIS — M6281 Muscle weakness (generalized): Secondary | ICD-10-CM | POA: Diagnosis not present

## 2019-06-28 DIAGNOSIS — E119 Type 2 diabetes mellitus without complications: Secondary | ICD-10-CM | POA: Diagnosis not present

## 2019-06-28 DIAGNOSIS — I252 Old myocardial infarction: Secondary | ICD-10-CM | POA: Diagnosis not present

## 2019-06-28 DIAGNOSIS — Z79899 Other long term (current) drug therapy: Secondary | ICD-10-CM | POA: Diagnosis not present

## 2019-06-28 DIAGNOSIS — N400 Enlarged prostate without lower urinary tract symptoms: Secondary | ICD-10-CM | POA: Diagnosis not present

## 2019-06-28 DIAGNOSIS — M1990 Unspecified osteoarthritis, unspecified site: Secondary | ICD-10-CM | POA: Diagnosis not present

## 2019-06-28 DIAGNOSIS — K219 Gastro-esophageal reflux disease without esophagitis: Secondary | ICD-10-CM | POA: Diagnosis not present

## 2019-06-28 DIAGNOSIS — F329 Major depressive disorder, single episode, unspecified: Secondary | ICD-10-CM | POA: Diagnosis not present

## 2019-06-28 DIAGNOSIS — R739 Hyperglycemia, unspecified: Secondary | ICD-10-CM | POA: Diagnosis not present

## 2019-07-04 DIAGNOSIS — K219 Gastro-esophageal reflux disease without esophagitis: Secondary | ICD-10-CM | POA: Diagnosis not present

## 2019-07-04 DIAGNOSIS — R1312 Dysphagia, oropharyngeal phase: Secondary | ICD-10-CM | POA: Diagnosis not present

## 2019-07-04 DIAGNOSIS — K5900 Constipation, unspecified: Secondary | ICD-10-CM | POA: Diagnosis not present

## 2019-07-04 DIAGNOSIS — I10 Essential (primary) hypertension: Secondary | ICD-10-CM | POA: Diagnosis not present

## 2019-07-04 DIAGNOSIS — E871 Hypo-osmolality and hyponatremia: Secondary | ICD-10-CM | POA: Diagnosis not present

## 2019-07-04 DIAGNOSIS — F329 Major depressive disorder, single episode, unspecified: Secondary | ICD-10-CM | POA: Diagnosis not present

## 2019-07-05 DIAGNOSIS — M1990 Unspecified osteoarthritis, unspecified site: Secondary | ICD-10-CM | POA: Diagnosis not present

## 2019-07-05 DIAGNOSIS — N400 Enlarged prostate without lower urinary tract symptoms: Secondary | ICD-10-CM | POA: Diagnosis not present

## 2019-07-05 DIAGNOSIS — R471 Dysarthria and anarthria: Secondary | ICD-10-CM | POA: Diagnosis not present

## 2019-07-05 DIAGNOSIS — I10 Essential (primary) hypertension: Secondary | ICD-10-CM | POA: Diagnosis not present

## 2019-07-07 DIAGNOSIS — H6123 Impacted cerumen, bilateral: Secondary | ICD-10-CM | POA: Diagnosis not present

## 2019-07-07 DIAGNOSIS — E782 Mixed hyperlipidemia: Secondary | ICD-10-CM | POA: Diagnosis not present

## 2019-07-07 DIAGNOSIS — I10 Essential (primary) hypertension: Secondary | ICD-10-CM | POA: Diagnosis not present

## 2019-07-07 DIAGNOSIS — K219 Gastro-esophageal reflux disease without esophagitis: Secondary | ICD-10-CM | POA: Diagnosis not present

## 2019-07-28 DIAGNOSIS — Z7982 Long term (current) use of aspirin: Secondary | ICD-10-CM | POA: Diagnosis not present

## 2019-07-28 DIAGNOSIS — I252 Old myocardial infarction: Secondary | ICD-10-CM | POA: Diagnosis not present

## 2019-07-28 DIAGNOSIS — Z7952 Long term (current) use of systemic steroids: Secondary | ICD-10-CM | POA: Diagnosis not present

## 2019-07-28 DIAGNOSIS — F329 Major depressive disorder, single episode, unspecified: Secondary | ICD-10-CM | POA: Diagnosis not present

## 2019-07-28 DIAGNOSIS — R471 Dysarthria and anarthria: Secondary | ICD-10-CM | POA: Diagnosis not present

## 2019-07-28 DIAGNOSIS — I10 Essential (primary) hypertension: Secondary | ICD-10-CM | POA: Diagnosis not present

## 2019-07-28 DIAGNOSIS — K5900 Constipation, unspecified: Secondary | ICD-10-CM | POA: Diagnosis not present

## 2019-07-28 DIAGNOSIS — N400 Enlarged prostate without lower urinary tract symptoms: Secondary | ICD-10-CM | POA: Diagnosis not present

## 2019-07-28 DIAGNOSIS — R278 Other lack of coordination: Secondary | ICD-10-CM | POA: Diagnosis not present

## 2019-07-28 DIAGNOSIS — R2681 Unsteadiness on feet: Secondary | ICD-10-CM | POA: Diagnosis not present

## 2019-07-28 DIAGNOSIS — M1990 Unspecified osteoarthritis, unspecified site: Secondary | ICD-10-CM | POA: Diagnosis not present

## 2019-07-28 DIAGNOSIS — R739 Hyperglycemia, unspecified: Secondary | ICD-10-CM | POA: Diagnosis not present

## 2019-07-28 DIAGNOSIS — F419 Anxiety disorder, unspecified: Secondary | ICD-10-CM | POA: Diagnosis not present

## 2019-07-28 DIAGNOSIS — M6281 Muscle weakness (generalized): Secondary | ICD-10-CM | POA: Diagnosis not present

## 2019-07-28 DIAGNOSIS — Z9181 History of falling: Secondary | ICD-10-CM | POA: Diagnosis not present

## 2019-07-28 DIAGNOSIS — R1312 Dysphagia, oropharyngeal phase: Secondary | ICD-10-CM | POA: Diagnosis not present

## 2019-07-28 DIAGNOSIS — K219 Gastro-esophageal reflux disease without esophagitis: Secondary | ICD-10-CM | POA: Diagnosis not present

## 2019-07-28 DIAGNOSIS — Z79899 Other long term (current) drug therapy: Secondary | ICD-10-CM | POA: Diagnosis not present

## 2019-08-01 DIAGNOSIS — F419 Anxiety disorder, unspecified: Secondary | ICD-10-CM | POA: Diagnosis not present

## 2019-08-01 DIAGNOSIS — K219 Gastro-esophageal reflux disease without esophagitis: Secondary | ICD-10-CM | POA: Diagnosis not present

## 2019-08-01 DIAGNOSIS — I10 Essential (primary) hypertension: Secondary | ICD-10-CM | POA: Diagnosis not present

## 2019-08-01 DIAGNOSIS — R1312 Dysphagia, oropharyngeal phase: Secondary | ICD-10-CM | POA: Diagnosis not present

## 2019-08-01 DIAGNOSIS — K5900 Constipation, unspecified: Secondary | ICD-10-CM | POA: Diagnosis not present

## 2019-08-01 DIAGNOSIS — F329 Major depressive disorder, single episode, unspecified: Secondary | ICD-10-CM | POA: Diagnosis not present

## 2019-08-08 DIAGNOSIS — K5900 Constipation, unspecified: Secondary | ICD-10-CM | POA: Diagnosis not present

## 2019-08-08 DIAGNOSIS — F419 Anxiety disorder, unspecified: Secondary | ICD-10-CM | POA: Diagnosis not present

## 2019-08-08 DIAGNOSIS — F329 Major depressive disorder, single episode, unspecified: Secondary | ICD-10-CM | POA: Diagnosis not present

## 2019-08-08 DIAGNOSIS — R1312 Dysphagia, oropharyngeal phase: Secondary | ICD-10-CM | POA: Diagnosis not present

## 2019-08-08 DIAGNOSIS — K219 Gastro-esophageal reflux disease without esophagitis: Secondary | ICD-10-CM | POA: Diagnosis not present

## 2019-08-08 DIAGNOSIS — I10 Essential (primary) hypertension: Secondary | ICD-10-CM | POA: Diagnosis not present

## 2019-08-18 DIAGNOSIS — Z03818 Encounter for observation for suspected exposure to other biological agents ruled out: Secondary | ICD-10-CM | POA: Diagnosis not present

## 2019-08-19 DIAGNOSIS — R1312 Dysphagia, oropharyngeal phase: Secondary | ICD-10-CM | POA: Diagnosis not present

## 2019-08-19 DIAGNOSIS — K219 Gastro-esophageal reflux disease without esophagitis: Secondary | ICD-10-CM | POA: Diagnosis not present

## 2019-08-19 DIAGNOSIS — I10 Essential (primary) hypertension: Secondary | ICD-10-CM | POA: Diagnosis not present

## 2019-08-19 DIAGNOSIS — F419 Anxiety disorder, unspecified: Secondary | ICD-10-CM | POA: Diagnosis not present

## 2019-08-19 DIAGNOSIS — F329 Major depressive disorder, single episode, unspecified: Secondary | ICD-10-CM | POA: Diagnosis not present

## 2019-08-19 DIAGNOSIS — K5900 Constipation, unspecified: Secondary | ICD-10-CM | POA: Diagnosis not present

## 2019-08-22 DIAGNOSIS — R1312 Dysphagia, oropharyngeal phase: Secondary | ICD-10-CM | POA: Diagnosis not present

## 2019-08-22 DIAGNOSIS — K5900 Constipation, unspecified: Secondary | ICD-10-CM | POA: Diagnosis not present

## 2019-08-22 DIAGNOSIS — F419 Anxiety disorder, unspecified: Secondary | ICD-10-CM | POA: Diagnosis not present

## 2019-08-22 DIAGNOSIS — F329 Major depressive disorder, single episode, unspecified: Secondary | ICD-10-CM | POA: Diagnosis not present

## 2019-08-22 DIAGNOSIS — K219 Gastro-esophageal reflux disease without esophagitis: Secondary | ICD-10-CM | POA: Diagnosis not present

## 2019-08-22 DIAGNOSIS — I10 Essential (primary) hypertension: Secondary | ICD-10-CM | POA: Diagnosis not present

## 2019-08-25 DIAGNOSIS — Z03818 Encounter for observation for suspected exposure to other biological agents ruled out: Secondary | ICD-10-CM | POA: Diagnosis not present

## 2019-08-27 DIAGNOSIS — F329 Major depressive disorder, single episode, unspecified: Secondary | ICD-10-CM | POA: Diagnosis not present

## 2019-08-27 DIAGNOSIS — M1990 Unspecified osteoarthritis, unspecified site: Secondary | ICD-10-CM | POA: Diagnosis not present

## 2019-08-27 DIAGNOSIS — R2681 Unsteadiness on feet: Secondary | ICD-10-CM | POA: Diagnosis not present

## 2019-08-27 DIAGNOSIS — F419 Anxiety disorder, unspecified: Secondary | ICD-10-CM | POA: Diagnosis not present

## 2019-08-27 DIAGNOSIS — I10 Essential (primary) hypertension: Secondary | ICD-10-CM | POA: Diagnosis not present

## 2019-08-27 DIAGNOSIS — Z7982 Long term (current) use of aspirin: Secondary | ICD-10-CM | POA: Diagnosis not present

## 2019-08-27 DIAGNOSIS — R278 Other lack of coordination: Secondary | ICD-10-CM | POA: Diagnosis not present

## 2019-08-27 DIAGNOSIS — Z7952 Long term (current) use of systemic steroids: Secondary | ICD-10-CM | POA: Diagnosis not present

## 2019-08-27 DIAGNOSIS — M6281 Muscle weakness (generalized): Secondary | ICD-10-CM | POA: Diagnosis not present

## 2019-08-27 DIAGNOSIS — R471 Dysarthria and anarthria: Secondary | ICD-10-CM | POA: Diagnosis not present

## 2019-08-27 DIAGNOSIS — K5900 Constipation, unspecified: Secondary | ICD-10-CM | POA: Diagnosis not present

## 2019-08-27 DIAGNOSIS — Z9181 History of falling: Secondary | ICD-10-CM | POA: Diagnosis not present

## 2019-08-27 DIAGNOSIS — R1312 Dysphagia, oropharyngeal phase: Secondary | ICD-10-CM | POA: Diagnosis not present

## 2019-08-27 DIAGNOSIS — N400 Enlarged prostate without lower urinary tract symptoms: Secondary | ICD-10-CM | POA: Diagnosis not present

## 2019-08-27 DIAGNOSIS — R739 Hyperglycemia, unspecified: Secondary | ICD-10-CM | POA: Diagnosis not present

## 2019-08-27 DIAGNOSIS — I252 Old myocardial infarction: Secondary | ICD-10-CM | POA: Diagnosis not present

## 2019-08-27 DIAGNOSIS — K219 Gastro-esophageal reflux disease without esophagitis: Secondary | ICD-10-CM | POA: Diagnosis not present

## 2019-08-27 DIAGNOSIS — Z79899 Other long term (current) drug therapy: Secondary | ICD-10-CM | POA: Diagnosis not present

## 2019-08-29 DIAGNOSIS — F329 Major depressive disorder, single episode, unspecified: Secondary | ICD-10-CM | POA: Diagnosis not present

## 2019-08-29 DIAGNOSIS — F419 Anxiety disorder, unspecified: Secondary | ICD-10-CM | POA: Diagnosis not present

## 2019-08-29 DIAGNOSIS — K219 Gastro-esophageal reflux disease without esophagitis: Secondary | ICD-10-CM | POA: Diagnosis not present

## 2019-08-29 DIAGNOSIS — Z03818 Encounter for observation for suspected exposure to other biological agents ruled out: Secondary | ICD-10-CM | POA: Diagnosis not present

## 2019-08-29 DIAGNOSIS — R1312 Dysphagia, oropharyngeal phase: Secondary | ICD-10-CM | POA: Diagnosis not present

## 2019-08-29 DIAGNOSIS — I10 Essential (primary) hypertension: Secondary | ICD-10-CM | POA: Diagnosis not present

## 2019-08-29 DIAGNOSIS — K5900 Constipation, unspecified: Secondary | ICD-10-CM | POA: Diagnosis not present

## 2019-08-30 DIAGNOSIS — Z23 Encounter for immunization: Secondary | ICD-10-CM | POA: Diagnosis not present

## 2019-09-05 DIAGNOSIS — K5900 Constipation, unspecified: Secondary | ICD-10-CM | POA: Diagnosis not present

## 2019-09-05 DIAGNOSIS — I10 Essential (primary) hypertension: Secondary | ICD-10-CM | POA: Diagnosis not present

## 2019-09-05 DIAGNOSIS — Z03818 Encounter for observation for suspected exposure to other biological agents ruled out: Secondary | ICD-10-CM | POA: Diagnosis not present

## 2019-09-05 DIAGNOSIS — F329 Major depressive disorder, single episode, unspecified: Secondary | ICD-10-CM | POA: Diagnosis not present

## 2019-09-05 DIAGNOSIS — R1312 Dysphagia, oropharyngeal phase: Secondary | ICD-10-CM | POA: Diagnosis not present

## 2019-09-05 DIAGNOSIS — K219 Gastro-esophageal reflux disease without esophagitis: Secondary | ICD-10-CM | POA: Diagnosis not present

## 2019-09-05 DIAGNOSIS — F419 Anxiety disorder, unspecified: Secondary | ICD-10-CM | POA: Diagnosis not present

## 2019-09-07 DIAGNOSIS — E7849 Other hyperlipidemia: Secondary | ICD-10-CM | POA: Diagnosis not present

## 2019-09-07 DIAGNOSIS — E559 Vitamin D deficiency, unspecified: Secondary | ICD-10-CM | POA: Diagnosis not present

## 2019-09-07 DIAGNOSIS — Z79899 Other long term (current) drug therapy: Secondary | ICD-10-CM | POA: Diagnosis not present

## 2019-09-07 DIAGNOSIS — E119 Type 2 diabetes mellitus without complications: Secondary | ICD-10-CM | POA: Diagnosis not present

## 2019-09-07 DIAGNOSIS — D518 Other vitamin B12 deficiency anemias: Secondary | ICD-10-CM | POA: Diagnosis not present

## 2019-09-07 DIAGNOSIS — E038 Other specified hypothyroidism: Secondary | ICD-10-CM | POA: Diagnosis not present

## 2019-09-12 DIAGNOSIS — K219 Gastro-esophageal reflux disease without esophagitis: Secondary | ICD-10-CM | POA: Diagnosis not present

## 2019-09-12 DIAGNOSIS — Z03818 Encounter for observation for suspected exposure to other biological agents ruled out: Secondary | ICD-10-CM | POA: Diagnosis not present

## 2019-09-12 DIAGNOSIS — K922 Gastrointestinal hemorrhage, unspecified: Secondary | ICD-10-CM | POA: Diagnosis not present

## 2019-09-12 DIAGNOSIS — D62 Acute posthemorrhagic anemia: Secondary | ICD-10-CM | POA: Diagnosis not present

## 2019-09-12 DIAGNOSIS — K5900 Constipation, unspecified: Secondary | ICD-10-CM | POA: Diagnosis not present

## 2019-09-12 DIAGNOSIS — I10 Essential (primary) hypertension: Secondary | ICD-10-CM | POA: Diagnosis not present

## 2019-09-12 DIAGNOSIS — D72829 Elevated white blood cell count, unspecified: Secondary | ICD-10-CM | POA: Diagnosis not present

## 2019-09-12 DIAGNOSIS — R739 Hyperglycemia, unspecified: Secondary | ICD-10-CM | POA: Diagnosis not present

## 2019-09-12 DIAGNOSIS — M6281 Muscle weakness (generalized): Secondary | ICD-10-CM | POA: Diagnosis not present

## 2019-09-12 DIAGNOSIS — E871 Hypo-osmolality and hyponatremia: Secondary | ICD-10-CM | POA: Diagnosis not present

## 2019-09-12 DIAGNOSIS — R2681 Unsteadiness on feet: Secondary | ICD-10-CM | POA: Diagnosis not present

## 2019-09-12 DIAGNOSIS — I214 Non-ST elevation (NSTEMI) myocardial infarction: Secondary | ICD-10-CM | POA: Diagnosis not present

## 2019-09-12 DIAGNOSIS — N400 Enlarged prostate without lower urinary tract symptoms: Secondary | ICD-10-CM | POA: Diagnosis not present

## 2019-09-13 DIAGNOSIS — K5900 Constipation, unspecified: Secondary | ICD-10-CM | POA: Diagnosis not present

## 2019-09-13 DIAGNOSIS — I10 Essential (primary) hypertension: Secondary | ICD-10-CM | POA: Diagnosis not present

## 2019-09-13 DIAGNOSIS — K219 Gastro-esophageal reflux disease without esophagitis: Secondary | ICD-10-CM | POA: Diagnosis not present

## 2019-09-13 DIAGNOSIS — F329 Major depressive disorder, single episode, unspecified: Secondary | ICD-10-CM | POA: Diagnosis not present

## 2019-09-13 DIAGNOSIS — R1312 Dysphagia, oropharyngeal phase: Secondary | ICD-10-CM | POA: Diagnosis not present

## 2019-09-13 DIAGNOSIS — F419 Anxiety disorder, unspecified: Secondary | ICD-10-CM | POA: Diagnosis not present

## 2019-09-22 DIAGNOSIS — F419 Anxiety disorder, unspecified: Secondary | ICD-10-CM | POA: Diagnosis not present

## 2019-09-22 DIAGNOSIS — K5900 Constipation, unspecified: Secondary | ICD-10-CM | POA: Diagnosis not present

## 2019-09-22 DIAGNOSIS — R1312 Dysphagia, oropharyngeal phase: Secondary | ICD-10-CM | POA: Diagnosis not present

## 2019-09-22 DIAGNOSIS — F329 Major depressive disorder, single episode, unspecified: Secondary | ICD-10-CM | POA: Diagnosis not present

## 2019-09-22 DIAGNOSIS — K219 Gastro-esophageal reflux disease without esophagitis: Secondary | ICD-10-CM | POA: Diagnosis not present

## 2019-09-22 DIAGNOSIS — I10 Essential (primary) hypertension: Secondary | ICD-10-CM | POA: Diagnosis not present

## 2019-09-26 DIAGNOSIS — M1990 Unspecified osteoarthritis, unspecified site: Secondary | ICD-10-CM | POA: Diagnosis not present

## 2019-09-26 DIAGNOSIS — Z7952 Long term (current) use of systemic steroids: Secondary | ICD-10-CM | POA: Diagnosis not present

## 2019-09-26 DIAGNOSIS — I10 Essential (primary) hypertension: Secondary | ICD-10-CM | POA: Diagnosis not present

## 2019-09-26 DIAGNOSIS — R471 Dysarthria and anarthria: Secondary | ICD-10-CM | POA: Diagnosis not present

## 2019-09-26 DIAGNOSIS — Z03818 Encounter for observation for suspected exposure to other biological agents ruled out: Secondary | ICD-10-CM | POA: Diagnosis not present

## 2019-09-26 DIAGNOSIS — R278 Other lack of coordination: Secondary | ICD-10-CM | POA: Diagnosis not present

## 2019-09-26 DIAGNOSIS — K219 Gastro-esophageal reflux disease without esophagitis: Secondary | ICD-10-CM | POA: Diagnosis not present

## 2019-09-26 DIAGNOSIS — N400 Enlarged prostate without lower urinary tract symptoms: Secondary | ICD-10-CM | POA: Diagnosis not present

## 2019-09-26 DIAGNOSIS — I252 Old myocardial infarction: Secondary | ICD-10-CM | POA: Diagnosis not present

## 2019-09-26 DIAGNOSIS — F329 Major depressive disorder, single episode, unspecified: Secondary | ICD-10-CM | POA: Diagnosis not present

## 2019-09-26 DIAGNOSIS — Z79899 Other long term (current) drug therapy: Secondary | ICD-10-CM | POA: Diagnosis not present

## 2019-09-26 DIAGNOSIS — Z7982 Long term (current) use of aspirin: Secondary | ICD-10-CM | POA: Diagnosis not present

## 2019-09-26 DIAGNOSIS — Z9181 History of falling: Secondary | ICD-10-CM | POA: Diagnosis not present

## 2019-09-26 DIAGNOSIS — K5900 Constipation, unspecified: Secondary | ICD-10-CM | POA: Diagnosis not present

## 2019-09-26 DIAGNOSIS — R1312 Dysphagia, oropharyngeal phase: Secondary | ICD-10-CM | POA: Diagnosis not present

## 2019-09-26 DIAGNOSIS — R2681 Unsteadiness on feet: Secondary | ICD-10-CM | POA: Diagnosis not present

## 2019-09-26 DIAGNOSIS — M6281 Muscle weakness (generalized): Secondary | ICD-10-CM | POA: Diagnosis not present

## 2019-09-26 DIAGNOSIS — R739 Hyperglycemia, unspecified: Secondary | ICD-10-CM | POA: Diagnosis not present

## 2019-09-26 DIAGNOSIS — F419 Anxiety disorder, unspecified: Secondary | ICD-10-CM | POA: Diagnosis not present

## 2019-09-27 DIAGNOSIS — R1312 Dysphagia, oropharyngeal phase: Secondary | ICD-10-CM | POA: Diagnosis not present

## 2019-09-27 DIAGNOSIS — K5900 Constipation, unspecified: Secondary | ICD-10-CM | POA: Diagnosis not present

## 2019-09-27 DIAGNOSIS — F419 Anxiety disorder, unspecified: Secondary | ICD-10-CM | POA: Diagnosis not present

## 2019-09-27 DIAGNOSIS — I10 Essential (primary) hypertension: Secondary | ICD-10-CM | POA: Diagnosis not present

## 2019-09-27 DIAGNOSIS — N400 Enlarged prostate without lower urinary tract symptoms: Secondary | ICD-10-CM | POA: Diagnosis not present

## 2019-09-27 DIAGNOSIS — K219 Gastro-esophageal reflux disease without esophagitis: Secondary | ICD-10-CM | POA: Diagnosis not present

## 2019-09-27 DIAGNOSIS — Z23 Encounter for immunization: Secondary | ICD-10-CM | POA: Diagnosis not present

## 2019-09-28 DIAGNOSIS — I10 Essential (primary) hypertension: Secondary | ICD-10-CM | POA: Diagnosis not present

## 2019-09-28 DIAGNOSIS — R1312 Dysphagia, oropharyngeal phase: Secondary | ICD-10-CM | POA: Diagnosis not present

## 2019-09-28 DIAGNOSIS — F329 Major depressive disorder, single episode, unspecified: Secondary | ICD-10-CM | POA: Diagnosis not present

## 2019-09-29 DIAGNOSIS — I10 Essential (primary) hypertension: Secondary | ICD-10-CM | POA: Diagnosis not present

## 2019-09-29 DIAGNOSIS — E782 Mixed hyperlipidemia: Secondary | ICD-10-CM | POA: Diagnosis not present

## 2019-09-29 DIAGNOSIS — K219 Gastro-esophageal reflux disease without esophagitis: Secondary | ICD-10-CM | POA: Diagnosis not present

## 2019-09-29 DIAGNOSIS — I739 Peripheral vascular disease, unspecified: Secondary | ICD-10-CM | POA: Diagnosis not present

## 2019-10-03 DIAGNOSIS — N400 Enlarged prostate without lower urinary tract symptoms: Secondary | ICD-10-CM | POA: Diagnosis not present

## 2019-10-03 DIAGNOSIS — K219 Gastro-esophageal reflux disease without esophagitis: Secondary | ICD-10-CM | POA: Diagnosis not present

## 2019-10-03 DIAGNOSIS — I10 Essential (primary) hypertension: Secondary | ICD-10-CM | POA: Diagnosis not present

## 2019-10-03 DIAGNOSIS — K5900 Constipation, unspecified: Secondary | ICD-10-CM | POA: Diagnosis not present

## 2019-10-03 DIAGNOSIS — R1312 Dysphagia, oropharyngeal phase: Secondary | ICD-10-CM | POA: Diagnosis not present

## 2019-10-03 DIAGNOSIS — F419 Anxiety disorder, unspecified: Secondary | ICD-10-CM | POA: Diagnosis not present

## 2019-10-12 DIAGNOSIS — Z20828 Contact with and (suspected) exposure to other viral communicable diseases: Secondary | ICD-10-CM | POA: Diagnosis not present

## 2019-10-12 DIAGNOSIS — Z6825 Body mass index (BMI) 25.0-25.9, adult: Secondary | ICD-10-CM | POA: Diagnosis not present

## 2019-10-12 DIAGNOSIS — R269 Unspecified abnormalities of gait and mobility: Secondary | ICD-10-CM | POA: Diagnosis not present

## 2019-10-12 DIAGNOSIS — I2510 Atherosclerotic heart disease of native coronary artery without angina pectoris: Secondary | ICD-10-CM | POA: Diagnosis not present

## 2019-10-12 DIAGNOSIS — K2100 Gastro-esophageal reflux disease with esophagitis, without bleeding: Secondary | ICD-10-CM | POA: Diagnosis not present

## 2019-10-12 DIAGNOSIS — D62 Acute posthemorrhagic anemia: Secondary | ICD-10-CM | POA: Diagnosis not present

## 2019-10-12 DIAGNOSIS — M6281 Muscle weakness (generalized): Secondary | ICD-10-CM | POA: Diagnosis not present

## 2019-10-12 DIAGNOSIS — I10 Essential (primary) hypertension: Secondary | ICD-10-CM | POA: Diagnosis not present

## 2019-10-12 DIAGNOSIS — G894 Chronic pain syndrome: Secondary | ICD-10-CM | POA: Diagnosis not present

## 2019-10-13 DIAGNOSIS — Z1329 Encounter for screening for other suspected endocrine disorder: Secondary | ICD-10-CM | POA: Diagnosis not present

## 2019-10-13 DIAGNOSIS — R739 Hyperglycemia, unspecified: Secondary | ICD-10-CM | POA: Diagnosis not present

## 2019-10-13 DIAGNOSIS — I10 Essential (primary) hypertension: Secondary | ICD-10-CM | POA: Diagnosis not present

## 2019-10-13 DIAGNOSIS — R531 Weakness: Secondary | ICD-10-CM | POA: Diagnosis not present

## 2019-10-13 DIAGNOSIS — Z1321 Encounter for screening for nutritional disorder: Secondary | ICD-10-CM | POA: Diagnosis not present

## 2019-10-14 DIAGNOSIS — M6281 Muscle weakness (generalized): Secondary | ICD-10-CM | POA: Diagnosis not present

## 2019-10-14 DIAGNOSIS — F419 Anxiety disorder, unspecified: Secondary | ICD-10-CM | POA: Diagnosis not present

## 2019-10-14 DIAGNOSIS — R54 Age-related physical debility: Secondary | ICD-10-CM | POA: Diagnosis not present

## 2019-10-14 DIAGNOSIS — R269 Unspecified abnormalities of gait and mobility: Secondary | ICD-10-CM | POA: Diagnosis not present

## 2019-10-19 DIAGNOSIS — Z20828 Contact with and (suspected) exposure to other viral communicable diseases: Secondary | ICD-10-CM | POA: Diagnosis not present

## 2019-10-19 DIAGNOSIS — F419 Anxiety disorder, unspecified: Secondary | ICD-10-CM | POA: Diagnosis not present

## 2019-10-19 DIAGNOSIS — R1312 Dysphagia, oropharyngeal phase: Secondary | ICD-10-CM | POA: Diagnosis not present

## 2019-10-19 DIAGNOSIS — K219 Gastro-esophageal reflux disease without esophagitis: Secondary | ICD-10-CM | POA: Diagnosis not present

## 2019-10-19 DIAGNOSIS — R54 Age-related physical debility: Secondary | ICD-10-CM | POA: Diagnosis not present

## 2019-10-19 DIAGNOSIS — I10 Essential (primary) hypertension: Secondary | ICD-10-CM | POA: Diagnosis not present

## 2019-10-19 DIAGNOSIS — Z20822 Contact with and (suspected) exposure to covid-19: Secondary | ICD-10-CM | POA: Diagnosis not present

## 2019-10-19 DIAGNOSIS — N400 Enlarged prostate without lower urinary tract symptoms: Secondary | ICD-10-CM | POA: Diagnosis not present

## 2019-10-19 DIAGNOSIS — K5900 Constipation, unspecified: Secondary | ICD-10-CM | POA: Diagnosis not present

## 2019-10-24 DIAGNOSIS — R54 Age-related physical debility: Secondary | ICD-10-CM | POA: Diagnosis not present

## 2019-10-24 DIAGNOSIS — R5381 Other malaise: Secondary | ICD-10-CM | POA: Diagnosis not present

## 2019-10-24 DIAGNOSIS — Z8701 Personal history of pneumonia (recurrent): Secondary | ICD-10-CM | POA: Diagnosis not present

## 2019-10-24 DIAGNOSIS — R0989 Other specified symptoms and signs involving the circulatory and respiratory systems: Secondary | ICD-10-CM | POA: Diagnosis not present

## 2019-10-24 DIAGNOSIS — R05 Cough: Secondary | ICD-10-CM | POA: Diagnosis not present

## 2019-10-26 DIAGNOSIS — F419 Anxiety disorder, unspecified: Secondary | ICD-10-CM | POA: Diagnosis not present

## 2019-10-26 DIAGNOSIS — R2681 Unsteadiness on feet: Secondary | ICD-10-CM | POA: Diagnosis not present

## 2019-10-26 DIAGNOSIS — M6281 Muscle weakness (generalized): Secondary | ICD-10-CM | POA: Diagnosis not present

## 2019-10-26 DIAGNOSIS — Z79899 Other long term (current) drug therapy: Secondary | ICD-10-CM | POA: Diagnosis not present

## 2019-10-26 DIAGNOSIS — M1990 Unspecified osteoarthritis, unspecified site: Secondary | ICD-10-CM | POA: Diagnosis not present

## 2019-10-26 DIAGNOSIS — R1312 Dysphagia, oropharyngeal phase: Secondary | ICD-10-CM | POA: Diagnosis not present

## 2019-10-26 DIAGNOSIS — Z7952 Long term (current) use of systemic steroids: Secondary | ICD-10-CM | POA: Diagnosis not present

## 2019-10-26 DIAGNOSIS — Z7982 Long term (current) use of aspirin: Secondary | ICD-10-CM | POA: Diagnosis not present

## 2019-10-26 DIAGNOSIS — R471 Dysarthria and anarthria: Secondary | ICD-10-CM | POA: Diagnosis not present

## 2019-10-26 DIAGNOSIS — K219 Gastro-esophageal reflux disease without esophagitis: Secondary | ICD-10-CM | POA: Diagnosis not present

## 2019-10-26 DIAGNOSIS — N400 Enlarged prostate without lower urinary tract symptoms: Secondary | ICD-10-CM | POA: Diagnosis not present

## 2019-10-26 DIAGNOSIS — R739 Hyperglycemia, unspecified: Secondary | ICD-10-CM | POA: Diagnosis not present

## 2019-10-26 DIAGNOSIS — I10 Essential (primary) hypertension: Secondary | ICD-10-CM | POA: Diagnosis not present

## 2019-10-26 DIAGNOSIS — Z9181 History of falling: Secondary | ICD-10-CM | POA: Diagnosis not present

## 2019-10-26 DIAGNOSIS — K5900 Constipation, unspecified: Secondary | ICD-10-CM | POA: Diagnosis not present

## 2019-10-26 DIAGNOSIS — I252 Old myocardial infarction: Secondary | ICD-10-CM | POA: Diagnosis not present

## 2019-10-26 DIAGNOSIS — R278 Other lack of coordination: Secondary | ICD-10-CM | POA: Diagnosis not present

## 2019-10-26 DIAGNOSIS — F329 Major depressive disorder, single episode, unspecified: Secondary | ICD-10-CM | POA: Diagnosis not present

## 2019-11-03 DIAGNOSIS — K5900 Constipation, unspecified: Secondary | ICD-10-CM | POA: Diagnosis not present

## 2019-11-03 DIAGNOSIS — I10 Essential (primary) hypertension: Secondary | ICD-10-CM | POA: Diagnosis not present

## 2019-11-03 DIAGNOSIS — N400 Enlarged prostate without lower urinary tract symptoms: Secondary | ICD-10-CM | POA: Diagnosis not present

## 2019-11-03 DIAGNOSIS — F419 Anxiety disorder, unspecified: Secondary | ICD-10-CM | POA: Diagnosis not present

## 2019-11-03 DIAGNOSIS — K219 Gastro-esophageal reflux disease without esophagitis: Secondary | ICD-10-CM | POA: Diagnosis not present

## 2019-11-03 DIAGNOSIS — R1312 Dysphagia, oropharyngeal phase: Secondary | ICD-10-CM | POA: Diagnosis not present

## 2019-11-06 DIAGNOSIS — Z23 Encounter for immunization: Secondary | ICD-10-CM | POA: Diagnosis not present

## 2019-11-06 DIAGNOSIS — R0600 Dyspnea, unspecified: Secondary | ICD-10-CM | POA: Diagnosis not present

## 2019-11-06 DIAGNOSIS — I10 Essential (primary) hypertension: Secondary | ICD-10-CM | POA: Diagnosis not present

## 2019-11-06 DIAGNOSIS — Z7952 Long term (current) use of systemic steroids: Secondary | ICD-10-CM | POA: Diagnosis not present

## 2019-11-06 DIAGNOSIS — F419 Anxiety disorder, unspecified: Secondary | ICD-10-CM | POA: Diagnosis present

## 2019-11-06 DIAGNOSIS — R531 Weakness: Secondary | ICD-10-CM | POA: Diagnosis not present

## 2019-11-06 DIAGNOSIS — K219 Gastro-esophageal reflux disease without esophagitis: Secondary | ICD-10-CM | POA: Diagnosis present

## 2019-11-06 DIAGNOSIS — E871 Hypo-osmolality and hyponatremia: Secondary | ICD-10-CM | POA: Diagnosis not present

## 2019-11-06 DIAGNOSIS — L03114 Cellulitis of left upper limb: Secondary | ICD-10-CM | POA: Diagnosis present

## 2019-11-06 DIAGNOSIS — I82612 Acute embolism and thrombosis of superficial veins of left upper extremity: Secondary | ICD-10-CM | POA: Diagnosis not present

## 2019-11-06 DIAGNOSIS — D72829 Elevated white blood cell count, unspecified: Secondary | ICD-10-CM | POA: Diagnosis not present

## 2019-11-06 DIAGNOSIS — I808 Phlebitis and thrombophlebitis of other sites: Secondary | ICD-10-CM | POA: Diagnosis present

## 2019-11-06 DIAGNOSIS — N400 Enlarged prostate without lower urinary tract symptoms: Secondary | ICD-10-CM | POA: Diagnosis present

## 2019-11-06 DIAGNOSIS — Z7982 Long term (current) use of aspirin: Secondary | ICD-10-CM | POA: Diagnosis not present

## 2019-11-06 DIAGNOSIS — Z79899 Other long term (current) drug therapy: Secondary | ICD-10-CM | POA: Diagnosis not present

## 2019-11-06 DIAGNOSIS — R0602 Shortness of breath: Secondary | ICD-10-CM | POA: Diagnosis not present

## 2019-11-06 DIAGNOSIS — R0902 Hypoxemia: Secondary | ICD-10-CM | POA: Diagnosis not present

## 2019-11-06 DIAGNOSIS — M1990 Unspecified osteoarthritis, unspecified site: Secondary | ICD-10-CM | POA: Diagnosis present

## 2019-11-07 DIAGNOSIS — R0600 Dyspnea, unspecified: Secondary | ICD-10-CM | POA: Diagnosis not present

## 2019-11-07 DIAGNOSIS — E871 Hypo-osmolality and hyponatremia: Secondary | ICD-10-CM | POA: Diagnosis not present

## 2019-11-07 DIAGNOSIS — I10 Essential (primary) hypertension: Secondary | ICD-10-CM | POA: Diagnosis not present

## 2019-11-07 DIAGNOSIS — R531 Weakness: Secondary | ICD-10-CM | POA: Diagnosis not present

## 2019-11-08 DIAGNOSIS — R531 Weakness: Secondary | ICD-10-CM | POA: Diagnosis not present

## 2019-11-08 DIAGNOSIS — R0600 Dyspnea, unspecified: Secondary | ICD-10-CM | POA: Diagnosis not present

## 2019-11-08 DIAGNOSIS — E871 Hypo-osmolality and hyponatremia: Secondary | ICD-10-CM | POA: Diagnosis not present

## 2019-11-08 DIAGNOSIS — I10 Essential (primary) hypertension: Secondary | ICD-10-CM | POA: Diagnosis not present

## 2019-11-10 DIAGNOSIS — F419 Anxiety disorder, unspecified: Secondary | ICD-10-CM | POA: Diagnosis not present

## 2019-11-10 DIAGNOSIS — K5900 Constipation, unspecified: Secondary | ICD-10-CM | POA: Diagnosis not present

## 2019-11-10 DIAGNOSIS — R1312 Dysphagia, oropharyngeal phase: Secondary | ICD-10-CM | POA: Diagnosis not present

## 2019-11-10 DIAGNOSIS — M6281 Muscle weakness (generalized): Secondary | ICD-10-CM | POA: Diagnosis not present

## 2019-11-10 DIAGNOSIS — I10 Essential (primary) hypertension: Secondary | ICD-10-CM | POA: Diagnosis not present

## 2019-11-10 DIAGNOSIS — N400 Enlarged prostate without lower urinary tract symptoms: Secondary | ICD-10-CM | POA: Diagnosis not present

## 2019-11-10 DIAGNOSIS — E871 Hypo-osmolality and hyponatremia: Secondary | ICD-10-CM | POA: Diagnosis not present

## 2019-11-10 DIAGNOSIS — K219 Gastro-esophageal reflux disease without esophagitis: Secondary | ICD-10-CM | POA: Diagnosis not present

## 2019-11-11 DIAGNOSIS — R0600 Dyspnea, unspecified: Secondary | ICD-10-CM | POA: Diagnosis not present

## 2019-11-11 DIAGNOSIS — D72829 Elevated white blood cell count, unspecified: Secondary | ICD-10-CM | POA: Diagnosis not present

## 2019-11-11 DIAGNOSIS — M6281 Muscle weakness (generalized): Secondary | ICD-10-CM | POA: Diagnosis not present

## 2019-11-11 DIAGNOSIS — R54 Age-related physical debility: Secondary | ICD-10-CM | POA: Diagnosis not present

## 2019-11-11 DIAGNOSIS — E871 Hypo-osmolality and hyponatremia: Secondary | ICD-10-CM | POA: Diagnosis not present

## 2019-11-11 DIAGNOSIS — R269 Unspecified abnormalities of gait and mobility: Secondary | ICD-10-CM | POA: Diagnosis not present

## 2019-11-11 DIAGNOSIS — I10 Essential (primary) hypertension: Secondary | ICD-10-CM | POA: Diagnosis not present

## 2019-11-11 DIAGNOSIS — I808 Phlebitis and thrombophlebitis of other sites: Secondary | ICD-10-CM | POA: Diagnosis not present

## 2019-11-14 DIAGNOSIS — I10 Essential (primary) hypertension: Secondary | ICD-10-CM | POA: Diagnosis not present

## 2019-11-14 DIAGNOSIS — F419 Anxiety disorder, unspecified: Secondary | ICD-10-CM | POA: Diagnosis not present

## 2019-11-14 DIAGNOSIS — K219 Gastro-esophageal reflux disease without esophagitis: Secondary | ICD-10-CM | POA: Diagnosis not present

## 2019-11-14 DIAGNOSIS — R1312 Dysphagia, oropharyngeal phase: Secondary | ICD-10-CM | POA: Diagnosis not present

## 2019-11-14 DIAGNOSIS — N400 Enlarged prostate without lower urinary tract symptoms: Secondary | ICD-10-CM | POA: Diagnosis not present

## 2019-11-14 DIAGNOSIS — K5900 Constipation, unspecified: Secondary | ICD-10-CM | POA: Diagnosis not present

## 2019-11-15 DIAGNOSIS — K5900 Constipation, unspecified: Secondary | ICD-10-CM | POA: Diagnosis not present

## 2019-11-15 DIAGNOSIS — F419 Anxiety disorder, unspecified: Secondary | ICD-10-CM | POA: Diagnosis not present

## 2019-11-15 DIAGNOSIS — N400 Enlarged prostate without lower urinary tract symptoms: Secondary | ICD-10-CM | POA: Diagnosis not present

## 2019-11-15 DIAGNOSIS — R1312 Dysphagia, oropharyngeal phase: Secondary | ICD-10-CM | POA: Diagnosis not present

## 2019-11-15 DIAGNOSIS — K219 Gastro-esophageal reflux disease without esophagitis: Secondary | ICD-10-CM | POA: Diagnosis not present

## 2019-11-15 DIAGNOSIS — I10 Essential (primary) hypertension: Secondary | ICD-10-CM | POA: Diagnosis not present

## 2019-11-17 DIAGNOSIS — N400 Enlarged prostate without lower urinary tract symptoms: Secondary | ICD-10-CM | POA: Diagnosis not present

## 2019-11-17 DIAGNOSIS — K5900 Constipation, unspecified: Secondary | ICD-10-CM | POA: Diagnosis not present

## 2019-11-17 DIAGNOSIS — R1312 Dysphagia, oropharyngeal phase: Secondary | ICD-10-CM | POA: Diagnosis not present

## 2019-11-17 DIAGNOSIS — F419 Anxiety disorder, unspecified: Secondary | ICD-10-CM | POA: Diagnosis not present

## 2019-11-17 DIAGNOSIS — E871 Hypo-osmolality and hyponatremia: Secondary | ICD-10-CM | POA: Diagnosis not present

## 2019-11-17 DIAGNOSIS — K219 Gastro-esophageal reflux disease without esophagitis: Secondary | ICD-10-CM | POA: Diagnosis not present

## 2019-11-17 DIAGNOSIS — I10 Essential (primary) hypertension: Secondary | ICD-10-CM | POA: Diagnosis not present

## 2019-11-20 DIAGNOSIS — A419 Sepsis, unspecified organism: Secondary | ICD-10-CM | POA: Diagnosis not present

## 2019-11-20 DIAGNOSIS — T68XXXA Hypothermia, initial encounter: Secondary | ICD-10-CM | POA: Diagnosis not present

## 2019-11-20 DIAGNOSIS — R404 Transient alteration of awareness: Secondary | ICD-10-CM | POA: Diagnosis not present

## 2019-11-20 DIAGNOSIS — R6520 Severe sepsis without septic shock: Secondary | ICD-10-CM | POA: Diagnosis not present

## 2019-11-20 DIAGNOSIS — R0902 Hypoxemia: Secondary | ICD-10-CM | POA: Diagnosis not present

## 2019-11-20 DIAGNOSIS — Z4682 Encounter for fitting and adjustment of non-vascular catheter: Secondary | ICD-10-CM | POA: Diagnosis not present

## 2019-11-20 DIAGNOSIS — I959 Hypotension, unspecified: Secondary | ICD-10-CM | POA: Diagnosis not present

## 2019-11-20 DIAGNOSIS — R918 Other nonspecific abnormal finding of lung field: Secondary | ICD-10-CM | POA: Diagnosis not present

## 2019-11-20 DIAGNOSIS — J9601 Acute respiratory failure with hypoxia: Secondary | ICD-10-CM | POA: Diagnosis not present

## 2019-11-20 DIAGNOSIS — R069 Unspecified abnormalities of breathing: Secondary | ICD-10-CM | POA: Diagnosis not present

## 2019-11-20 DIAGNOSIS — J80 Acute respiratory distress syndrome: Secondary | ICD-10-CM | POA: Diagnosis not present

## 2019-11-21 DIAGNOSIS — I5043 Acute on chronic combined systolic (congestive) and diastolic (congestive) heart failure: Secondary | ICD-10-CM | POA: Diagnosis present

## 2019-11-21 DIAGNOSIS — Z6822 Body mass index (BMI) 22.0-22.9, adult: Secondary | ICD-10-CM | POA: Diagnosis not present

## 2019-11-21 DIAGNOSIS — K828 Other specified diseases of gallbladder: Secondary | ICD-10-CM | POA: Diagnosis not present

## 2019-11-21 DIAGNOSIS — T68XXXA Hypothermia, initial encounter: Secondary | ICD-10-CM | POA: Diagnosis not present

## 2019-11-21 DIAGNOSIS — A419 Sepsis, unspecified organism: Secondary | ICD-10-CM | POA: Diagnosis not present

## 2019-11-21 DIAGNOSIS — R001 Bradycardia, unspecified: Secondary | ICD-10-CM | POA: Diagnosis not present

## 2019-11-21 DIAGNOSIS — I361 Nonrheumatic tricuspid (valve) insufficiency: Secondary | ICD-10-CM | POA: Diagnosis not present

## 2019-11-21 DIAGNOSIS — J80 Acute respiratory distress syndrome: Secondary | ICD-10-CM | POA: Diagnosis not present

## 2019-11-21 DIAGNOSIS — R269 Unspecified abnormalities of gait and mobility: Secondary | ICD-10-CM | POA: Diagnosis not present

## 2019-11-21 DIAGNOSIS — Z7982 Long term (current) use of aspirin: Secondary | ICD-10-CM | POA: Diagnosis not present

## 2019-11-21 DIAGNOSIS — J9602 Acute respiratory failure with hypercapnia: Secondary | ICD-10-CM | POA: Diagnosis present

## 2019-11-21 DIAGNOSIS — I10 Essential (primary) hypertension: Secondary | ICD-10-CM | POA: Diagnosis not present

## 2019-11-21 DIAGNOSIS — J181 Lobar pneumonia, unspecified organism: Secondary | ICD-10-CM | POA: Diagnosis not present

## 2019-11-21 DIAGNOSIS — E46 Unspecified protein-calorie malnutrition: Secondary | ICD-10-CM | POA: Diagnosis not present

## 2019-11-21 DIAGNOSIS — I509 Heart failure, unspecified: Secondary | ICD-10-CM | POA: Diagnosis not present

## 2019-11-21 DIAGNOSIS — J9690 Respiratory failure, unspecified, unspecified whether with hypoxia or hypercapnia: Secondary | ICD-10-CM | POA: Diagnosis not present

## 2019-11-21 DIAGNOSIS — J158 Pneumonia due to other specified bacteria: Secondary | ICD-10-CM | POA: Diagnosis not present

## 2019-11-21 DIAGNOSIS — I959 Hypotension, unspecified: Secondary | ICD-10-CM | POA: Diagnosis present

## 2019-11-21 DIAGNOSIS — Z741 Need for assistance with personal care: Secondary | ICD-10-CM | POA: Diagnosis not present

## 2019-11-21 DIAGNOSIS — E440 Moderate protein-calorie malnutrition: Secondary | ICD-10-CM | POA: Diagnosis present

## 2019-11-21 DIAGNOSIS — I252 Old myocardial infarction: Secondary | ICD-10-CM | POA: Diagnosis not present

## 2019-11-21 DIAGNOSIS — R680 Hypothermia, not associated with low environmental temperature: Secondary | ICD-10-CM | POA: Diagnosis present

## 2019-11-21 DIAGNOSIS — Z452 Encounter for adjustment and management of vascular access device: Secondary | ICD-10-CM | POA: Diagnosis not present

## 2019-11-21 DIAGNOSIS — Z4682 Encounter for fitting and adjustment of non-vascular catheter: Secondary | ICD-10-CM | POA: Diagnosis not present

## 2019-11-21 DIAGNOSIS — M4855XA Collapsed vertebra, not elsewhere classified, thoracolumbar region, initial encounter for fracture: Secondary | ICD-10-CM | POA: Diagnosis present

## 2019-11-21 DIAGNOSIS — R6520 Severe sepsis without septic shock: Secondary | ICD-10-CM | POA: Diagnosis not present

## 2019-11-21 DIAGNOSIS — F0390 Unspecified dementia without behavioral disturbance: Secondary | ICD-10-CM | POA: Diagnosis present

## 2019-11-21 DIAGNOSIS — F419 Anxiety disorder, unspecified: Secondary | ICD-10-CM | POA: Diagnosis present

## 2019-11-21 DIAGNOSIS — J9601 Acute respiratory failure with hypoxia: Secondary | ICD-10-CM | POA: Diagnosis present

## 2019-11-21 DIAGNOSIS — E875 Hyperkalemia: Secondary | ICD-10-CM | POA: Diagnosis present

## 2019-11-21 DIAGNOSIS — R0902 Hypoxemia: Secondary | ICD-10-CM | POA: Diagnosis not present

## 2019-11-21 DIAGNOSIS — R1312 Dysphagia, oropharyngeal phase: Secondary | ICD-10-CM | POA: Diagnosis present

## 2019-11-21 DIAGNOSIS — Z9911 Dependence on respirator [ventilator] status: Secondary | ICD-10-CM | POA: Diagnosis not present

## 2019-11-21 DIAGNOSIS — J9811 Atelectasis: Secondary | ICD-10-CM | POA: Diagnosis not present

## 2019-11-21 DIAGNOSIS — M1990 Unspecified osteoarthritis, unspecified site: Secondary | ICD-10-CM | POA: Diagnosis not present

## 2019-11-21 DIAGNOSIS — E872 Acidosis: Secondary | ICD-10-CM | POA: Diagnosis present

## 2019-11-21 DIAGNOSIS — R262 Difficulty in walking, not elsewhere classified: Secondary | ICD-10-CM | POA: Diagnosis present

## 2019-11-21 DIAGNOSIS — T17890A Other foreign object in other parts of respiratory tract causing asphyxiation, initial encounter: Secondary | ICD-10-CM | POA: Diagnosis present

## 2019-11-21 DIAGNOSIS — Z7952 Long term (current) use of systemic steroids: Secondary | ICD-10-CM | POA: Diagnosis not present

## 2019-11-21 DIAGNOSIS — K219 Gastro-esophageal reflux disease without esophagitis: Secondary | ICD-10-CM | POA: Diagnosis not present

## 2019-11-21 DIAGNOSIS — J189 Pneumonia, unspecified organism: Secondary | ICD-10-CM | POA: Diagnosis not present

## 2019-11-21 DIAGNOSIS — J151 Pneumonia due to Pseudomonas: Secondary | ICD-10-CM | POA: Diagnosis not present

## 2019-11-21 DIAGNOSIS — Z9981 Dependence on supplemental oxygen: Secondary | ICD-10-CM | POA: Diagnosis not present

## 2019-11-21 DIAGNOSIS — I110 Hypertensive heart disease with heart failure: Secondary | ICD-10-CM | POA: Diagnosis present

## 2019-11-21 DIAGNOSIS — R846 Abnormal cytological findings in specimens from respiratory organs and thorax: Secondary | ICD-10-CM | POA: Diagnosis not present

## 2019-11-21 DIAGNOSIS — Z743 Need for continuous supervision: Secondary | ICD-10-CM | POA: Diagnosis not present

## 2019-11-21 DIAGNOSIS — I5041 Acute combined systolic (congestive) and diastolic (congestive) heart failure: Secondary | ICD-10-CM | POA: Diagnosis not present

## 2019-11-21 DIAGNOSIS — H44003 Unspecified purulent endophthalmitis, bilateral: Secondary | ICD-10-CM | POA: Diagnosis not present

## 2019-11-21 DIAGNOSIS — J90 Pleural effusion, not elsewhere classified: Secondary | ICD-10-CM | POA: Diagnosis not present

## 2019-11-21 DIAGNOSIS — S22080A Wedge compression fracture of T11-T12 vertebra, initial encounter for closed fracture: Secondary | ICD-10-CM | POA: Diagnosis not present

## 2019-11-21 DIAGNOSIS — R918 Other nonspecific abnormal finding of lung field: Secondary | ICD-10-CM | POA: Diagnosis not present

## 2019-11-21 DIAGNOSIS — M6281 Muscle weakness (generalized): Secondary | ICD-10-CM | POA: Diagnosis not present

## 2019-11-21 DIAGNOSIS — R279 Unspecified lack of coordination: Secondary | ICD-10-CM | POA: Diagnosis not present

## 2019-11-23 DIAGNOSIS — I361 Nonrheumatic tricuspid (valve) insufficiency: Secondary | ICD-10-CM

## 2019-12-08 DIAGNOSIS — M1990 Unspecified osteoarthritis, unspecified site: Secondary | ICD-10-CM | POA: Diagnosis not present

## 2019-12-08 DIAGNOSIS — A419 Sepsis, unspecified organism: Secondary | ICD-10-CM | POA: Diagnosis not present

## 2019-12-08 DIAGNOSIS — Z7401 Bed confinement status: Secondary | ICD-10-CM | POA: Diagnosis not present

## 2019-12-08 DIAGNOSIS — J9601 Acute respiratory failure with hypoxia: Secondary | ICD-10-CM | POA: Diagnosis not present

## 2019-12-08 DIAGNOSIS — R4781 Slurred speech: Secondary | ICD-10-CM | POA: Diagnosis not present

## 2019-12-08 DIAGNOSIS — R918 Other nonspecific abnormal finding of lung field: Secondary | ICD-10-CM | POA: Diagnosis not present

## 2019-12-08 DIAGNOSIS — D72829 Elevated white blood cell count, unspecified: Secondary | ICD-10-CM | POA: Diagnosis not present

## 2019-12-08 DIAGNOSIS — I5041 Acute combined systolic (congestive) and diastolic (congestive) heart failure: Secondary | ICD-10-CM | POA: Diagnosis not present

## 2019-12-08 DIAGNOSIS — J189 Pneumonia, unspecified organism: Secondary | ICD-10-CM | POA: Diagnosis not present

## 2019-12-08 DIAGNOSIS — K219 Gastro-esophageal reflux disease without esophagitis: Secondary | ICD-10-CM | POA: Diagnosis not present

## 2019-12-08 DIAGNOSIS — I429 Cardiomyopathy, unspecified: Secondary | ICD-10-CM | POA: Diagnosis not present

## 2019-12-08 DIAGNOSIS — M6281 Muscle weakness (generalized): Secondary | ICD-10-CM | POA: Diagnosis not present

## 2019-12-08 DIAGNOSIS — E875 Hyperkalemia: Secondary | ICD-10-CM | POA: Diagnosis not present

## 2019-12-08 DIAGNOSIS — R0989 Other specified symptoms and signs involving the circulatory and respiratory systems: Secondary | ICD-10-CM | POA: Diagnosis not present

## 2019-12-08 DIAGNOSIS — Z79899 Other long term (current) drug therapy: Secondary | ICD-10-CM | POA: Diagnosis not present

## 2019-12-08 DIAGNOSIS — Z9981 Dependence on supplemental oxygen: Secondary | ICD-10-CM | POA: Diagnosis not present

## 2019-12-08 DIAGNOSIS — I509 Heart failure, unspecified: Secondary | ICD-10-CM | POA: Diagnosis not present

## 2019-12-08 DIAGNOSIS — I469 Cardiac arrest, cause unspecified: Secondary | ICD-10-CM | POA: Diagnosis not present

## 2019-12-08 DIAGNOSIS — I959 Hypotension, unspecified: Secondary | ICD-10-CM | POA: Diagnosis not present

## 2019-12-08 DIAGNOSIS — J181 Lobar pneumonia, unspecified organism: Secondary | ICD-10-CM | POA: Diagnosis not present

## 2019-12-08 DIAGNOSIS — S22080A Wedge compression fracture of T11-T12 vertebra, initial encounter for closed fracture: Secondary | ICD-10-CM | POA: Diagnosis not present

## 2019-12-08 DIAGNOSIS — J479 Bronchiectasis, uncomplicated: Secondary | ICD-10-CM | POA: Diagnosis not present

## 2019-12-08 DIAGNOSIS — F0390 Unspecified dementia without behavioral disturbance: Secondary | ICD-10-CM | POA: Diagnosis not present

## 2019-12-08 DIAGNOSIS — F419 Anxiety disorder, unspecified: Secondary | ICD-10-CM | POA: Diagnosis not present

## 2019-12-08 DIAGNOSIS — R1312 Dysphagia, oropharyngeal phase: Secondary | ICD-10-CM | POA: Diagnosis not present

## 2019-12-08 DIAGNOSIS — R0902 Hypoxemia: Secondary | ICD-10-CM | POA: Diagnosis not present

## 2019-12-08 DIAGNOSIS — R5381 Other malaise: Secondary | ICD-10-CM | POA: Diagnosis not present

## 2019-12-08 DIAGNOSIS — Z741 Need for assistance with personal care: Secondary | ICD-10-CM | POA: Diagnosis not present

## 2019-12-08 DIAGNOSIS — M2550 Pain in unspecified joint: Secondary | ICD-10-CM | POA: Diagnosis not present

## 2019-12-08 DIAGNOSIS — R279 Unspecified lack of coordination: Secondary | ICD-10-CM | POA: Diagnosis not present

## 2019-12-08 DIAGNOSIS — Z743 Need for continuous supervision: Secondary | ICD-10-CM | POA: Diagnosis not present

## 2019-12-08 DIAGNOSIS — N179 Acute kidney failure, unspecified: Secondary | ICD-10-CM | POA: Diagnosis not present

## 2019-12-08 DIAGNOSIS — I472 Ventricular tachycardia: Secondary | ICD-10-CM | POA: Diagnosis not present

## 2019-12-08 DIAGNOSIS — E440 Moderate protein-calorie malnutrition: Secondary | ICD-10-CM | POA: Diagnosis not present

## 2019-12-08 DIAGNOSIS — R4182 Altered mental status, unspecified: Secondary | ICD-10-CM | POA: Diagnosis not present

## 2019-12-08 DIAGNOSIS — J80 Acute respiratory distress syndrome: Secondary | ICD-10-CM | POA: Diagnosis not present

## 2019-12-08 DIAGNOSIS — E860 Dehydration: Secondary | ICD-10-CM | POA: Diagnosis not present

## 2019-12-08 DIAGNOSIS — I361 Nonrheumatic tricuspid (valve) insufficiency: Secondary | ICD-10-CM | POA: Diagnosis not present

## 2019-12-08 DIAGNOSIS — R799 Abnormal finding of blood chemistry, unspecified: Secondary | ICD-10-CM | POA: Diagnosis not present

## 2019-12-08 DIAGNOSIS — J9602 Acute respiratory failure with hypercapnia: Secondary | ICD-10-CM | POA: Diagnosis not present

## 2019-12-08 DIAGNOSIS — Z7982 Long term (current) use of aspirin: Secondary | ICD-10-CM | POA: Diagnosis not present

## 2019-12-08 DIAGNOSIS — I10 Essential (primary) hypertension: Secondary | ICD-10-CM | POA: Diagnosis not present

## 2019-12-08 DIAGNOSIS — R739 Hyperglycemia, unspecified: Secondary | ICD-10-CM | POA: Diagnosis not present

## 2019-12-08 DIAGNOSIS — E872 Acidosis: Secondary | ICD-10-CM | POA: Diagnosis not present

## 2019-12-08 DIAGNOSIS — R269 Unspecified abnormalities of gait and mobility: Secondary | ICD-10-CM | POA: Diagnosis not present

## 2019-12-08 DIAGNOSIS — R069 Unspecified abnormalities of breathing: Secondary | ICD-10-CM | POA: Diagnosis not present

## 2019-12-08 DIAGNOSIS — I252 Old myocardial infarction: Secondary | ICD-10-CM | POA: Diagnosis not present

## 2019-12-08 DIAGNOSIS — J9811 Atelectasis: Secondary | ICD-10-CM | POA: Diagnosis not present

## 2019-12-08 DIAGNOSIS — R531 Weakness: Secondary | ICD-10-CM | POA: Diagnosis not present

## 2019-12-08 DIAGNOSIS — K922 Gastrointestinal hemorrhage, unspecified: Secondary | ICD-10-CM | POA: Diagnosis not present

## 2019-12-08 DIAGNOSIS — R0602 Shortness of breath: Secondary | ICD-10-CM | POA: Diagnosis not present

## 2019-12-08 DIAGNOSIS — R262 Difficulty in walking, not elsewhere classified: Secondary | ICD-10-CM | POA: Diagnosis not present

## 2019-12-08 DIAGNOSIS — J9600 Acute respiratory failure, unspecified whether with hypoxia or hypercapnia: Secondary | ICD-10-CM | POA: Diagnosis not present

## 2019-12-14 DIAGNOSIS — J189 Pneumonia, unspecified organism: Secondary | ICD-10-CM | POA: Diagnosis not present

## 2019-12-14 DIAGNOSIS — A419 Sepsis, unspecified organism: Secondary | ICD-10-CM | POA: Diagnosis not present

## 2019-12-14 DIAGNOSIS — I10 Essential (primary) hypertension: Secondary | ICD-10-CM | POA: Diagnosis not present

## 2019-12-14 DIAGNOSIS — I509 Heart failure, unspecified: Secondary | ICD-10-CM | POA: Diagnosis not present

## 2019-12-17 ENCOUNTER — Emergency Department (HOSPITAL_COMMUNITY): Payer: Medicare Other

## 2019-12-17 ENCOUNTER — Encounter (HOSPITAL_COMMUNITY): Payer: Self-pay

## 2019-12-17 ENCOUNTER — Emergency Department (HOSPITAL_COMMUNITY)
Admission: EM | Admit: 2019-12-17 | Discharge: 2019-12-17 | Disposition: A | Discharge status: Home / Self Care | Payer: Medicare Other | Attending: Emergency Medicine | Admitting: Emergency Medicine

## 2019-12-17 ENCOUNTER — Other Ambulatory Visit: Payer: Self-pay

## 2019-12-17 DIAGNOSIS — I10 Essential (primary) hypertension: Secondary | ICD-10-CM | POA: Diagnosis not present

## 2019-12-17 DIAGNOSIS — J189 Pneumonia, unspecified organism: Secondary | ICD-10-CM

## 2019-12-17 DIAGNOSIS — I252 Old myocardial infarction: Secondary | ICD-10-CM | POA: Diagnosis not present

## 2019-12-17 DIAGNOSIS — Z7982 Long term (current) use of aspirin: Secondary | ICD-10-CM | POA: Insufficient documentation

## 2019-12-17 DIAGNOSIS — Z79899 Other long term (current) drug therapy: Secondary | ICD-10-CM | POA: Insufficient documentation

## 2019-12-17 DIAGNOSIS — J181 Lobar pneumonia, unspecified organism: Secondary | ICD-10-CM | POA: Insufficient documentation

## 2019-12-17 DIAGNOSIS — J9811 Atelectasis: Secondary | ICD-10-CM | POA: Diagnosis not present

## 2019-12-17 DIAGNOSIS — Z9981 Dependence on supplemental oxygen: Secondary | ICD-10-CM | POA: Diagnosis not present

## 2019-12-17 DIAGNOSIS — J479 Bronchiectasis, uncomplicated: Secondary | ICD-10-CM | POA: Diagnosis not present

## 2019-12-17 LAB — COMPREHENSIVE METABOLIC PANEL
ALT: 39 U/L (ref 0–44)
AST: 25 U/L (ref 15–41)
Albumin: 2.8 g/dL — ABNORMAL LOW (ref 3.5–5.0)
Alkaline Phosphatase: 109 U/L (ref 38–126)
Anion gap: 8 (ref 5–15)
BUN: 14 mg/dL (ref 8–23)
CO2: 29 mmol/L (ref 22–32)
Calcium: 8.6 mg/dL — ABNORMAL LOW (ref 8.9–10.3)
Chloride: 93 mmol/L — ABNORMAL LOW (ref 98–111)
Creatinine, Ser: 0.58 mg/dL — ABNORMAL LOW (ref 0.61–1.24)
GFR calc Af Amer: >60 mL/min (ref >60)
GFR calc non Af Amer: >60 mL/min (ref >60)
Glucose, Bld: 153 mg/dL — ABNORMAL HIGH (ref 70–99)
Potassium: 5 mmol/L (ref 3.5–5.1)
Sodium: 130 mmol/L — ABNORMAL LOW (ref 135–145)
Total Bilirubin: 0.5 mg/dL (ref 0.3–1.2)
Total Protein: 5.6 g/dL — ABNORMAL LOW (ref 6.5–8.1)

## 2019-12-17 LAB — CBC WITH DIFFERENTIAL/PLATELET
Abs Immature Granulocytes: 0.31 10*3/uL — ABNORMAL HIGH (ref 0.00–0.07)
Basophils Absolute: 0 10*3/uL (ref 0.0–0.1)
Basophils Relative: 0 %
Eosinophils Absolute: 0 10*3/uL (ref 0.0–0.5)
Eosinophils Relative: 0 %
HCT: 35.2 % — ABNORMAL LOW (ref 39.0–52.0)
Hemoglobin: 11.3 g/dL — ABNORMAL LOW (ref 13.0–17.0)
Immature Granulocytes: 2 %
Lymphocytes Relative: 9 %
Lymphs Abs: 1.3 10*3/uL (ref 0.7–4.0)
MCH: 30.4 pg (ref 26.0–34.0)
MCHC: 32.1 g/dL (ref 30.0–36.0)
MCV: 94.6 fL (ref 80.0–100.0)
Monocytes Absolute: 0.4 10*3/uL (ref 0.1–1.0)
Monocytes Relative: 3 %
Neutro Abs: 12 10*3/uL — ABNORMAL HIGH (ref 1.7–7.7)
Neutrophils Relative %: 86 %
Platelets: 228 10*3/uL (ref 150–400)
RBC: 3.72 MIL/uL — ABNORMAL LOW (ref 4.22–5.81)
RDW: 16.9 % — ABNORMAL HIGH (ref 11.5–15.5)
WBC: 14 10*3/uL — ABNORMAL HIGH (ref 4.0–10.5)
nRBC: 0 % (ref 0.0–0.2)

## 2019-12-17 LAB — LACTIC ACID, PLASMA: Lactic Acid, Venous: 1.7 mmol/L (ref 0.5–1.9)

## 2019-12-17 MED ORDER — DOXYCYCLINE HYCLATE 100 MG PO CAPS: 100.0000 mg | ORAL_CAPSULE | Freq: Two times a day (BID) | ORAL | 0 refills | Status: AC

## 2019-12-17 MED ORDER — IOHEXOL 300 MG/ML  SOLN
75.0000 mL | Freq: Once | INTRAMUSCULAR | Status: AC | PRN
  Administered 2019-12-17: 75 mL via INTRAVENOUS

## 2019-12-17 NOTE — ED Notes (Signed)
Pt transported to radiology.

## 2019-12-17 NOTE — Discharge Instructions (Signed)
Please follow up with your doctor.  Return for worsening trouble breathing or confusion.

## 2019-12-17 NOTE — ED Notes (Signed)
Requested NS to call PT[facility] for this pt.  

## 2019-12-17 NOTE — ED Notes (Signed)
Called son and left msg. Pt thought he might be able to pick him up.

## 2019-12-17 NOTE — ED Provider Notes (Signed)
[facility] EMERGENCY DEPARTMENT Provider Note   CSN: [id number] Arrival date & time: 12/17/19  1315     History Chief Complaint  Patient presents with  . Weakness  . Pneumonia    left lung    Tyler Petersen is a 86 y.o. male.  86yo M w/ PMH including NSTEMI, HTN, respiratory failure on 2L O2 who p/w shortness of breath and weakness.  The patient has been coughing for the past 1 week and states that today he was diagnosed with pneumonia at his nursing facility.  EMS reported that he had been complaining of weakness and shortness of breath although patient denies shortness of breath for me.  He denies any associated chest pain.  He is not aware of any fevers and states he has had no vomiting, diarrhea, or urinary problems.  He reports he tested negative for COVID-19.  He is chronically on 2 L of oxygen for the past 1 year.  The history is provided by the patient.  Weakness Pneumonia       Past Medical History:  Diagnosis Date  . Acute blood loss anemia   . Anxiety   . BPH (benign prostatic hyperplasia)   . GI bleed   . HTN (hypertension)   . Hyponatremia   . NSTEMI (non-ST elevated myocardial infarction) (HCC)   . Osteoarthritis   . Osteoarthritis   . Rhabdomyolysis   . UTI (urinary tract infection)     Patient Active Problem List   Diagnosis Date Noted  . NSTEMI (non-ST elevated myocardial infarction) (HCC)   . Acute blood loss anemia   . Osteoarthritis   . Heme positive stool   . Symptomatic anemia 03/07/2019  . GI bleed 03/07/2019  . Anxiety 03/07/2019  . BPH (benign prostatic hyperplasia) 03/07/2019  . Respiratory failure with hypoxia (HCC) 03/07/2019  . Hyponatremia 03/07/2019  . Leukocytosis 03/07/2019    Past Surgical History:  Procedure Laterality Date  . COLONOSCOPY WITH PROPOFOL N/A 03/09/2019   Procedure: COLONOSCOPY WITH PROPOFOL;  Surgeon: Beverley Fiedler, MD;  Location[facility] ENDOSCOPY;  Service: Gastroenterology;  Laterality:  N/A;  . ESOPHAGOGASTRODUODENOSCOPY (EGD) WITH PROPOFOL N/A 03/08/2019   Procedure: ESOPHAGOGASTRODUODENOSCOPY (EGD) WITH PROPOFOL;  Surgeon: Beverley Fiedler, MD;  Location: [facility] ENDOSCOPY;  Service: Gastroenterology;  Laterality: N/A;  . ORIF right pelvic/hip  right       History reviewed. No pertinent family history.  Social History   Tobacco Use  . Smoking status: Never Smoker  . Smokeless tobacco: Never Used  Substance Use Topics  . Alcohol use: Not Currently  . Drug use: Not Currently    Home Medications Prior to Admission medications   Medication Sig Start Date End Date Taking? Authorizing Provider  acetaminophen (TYLENOL) 325 MG tablet Take 650 mg by mouth every 6 (six) hours as needed.    [provider]  amLODipine (NORVASC) 10 MG tablet Take 10 mg by mouth every Monday, Wednesday, and Friday.  01/16/19   [provider]  aspirin EC 81 MG tablet Take 81 mg by mouth daily.    [provider]  bisacodyl (DULCOLAX) 10 MG suppository Place 10 mg rectally as needed for mild constipation, moderate constipation or severe constipation.    [provider]  finasteride (PROSCAR) 5 MG tablet Take 5 mg by mouth daily.    [provider]  FLUoxetine (PROZAC) 10 MG capsule Take 10 mg by mouth daily.    [provider]  losartan (COZAAR) 50 MG  tablet Take 50 mg by mouth daily.    [provider]  magnesium hydroxide (MILK OF MAGNESIA) 400 MG/5ML suspension Take 30 mLs by mouth daily as needed for mild constipation or moderate constipation.    [provider]  OXYGEN Inhale 2 L into the lungs continuous.    [provider]  pantoprazole (PROTONIX) 40 MG tablet Take 40 mg by mouth daily.     [provider]  tamsulosin (FLOMAX) 0.4 MG CAPS capsule Take 0.4 mg by mouth.    [provider]  traZODone (DESYREL) 50 MG tablet Take 50 mg by mouth at bedtime. 02/10/19   [provider]     Allergies    Influenza vaccines  Review of Systems   Review of Systems  Neurological: Positive for weakness.   All other systems reviewed and are negative except that which was mentioned in HPI  Physical Exam Updated Vital Signs BP (!) 145/79   Pulse 86   Temp 97.6 F (36.4 C) (Oral)   Resp 15   Ht 5\' 8"  (1.727 m)   Wt 70.3 kg   SpO2 100%   BMI 23.57 kg/m   Physical Exam Vitals and nursing note reviewed.  Constitutional:      General: He is not in acute distress.    Appearance: He is well-developed.  HENT:     Head: Normocephalic and atraumatic.  Eyes:     Conjunctiva/sclera: Conjunctivae normal.  Cardiovascular:     Rate and Rhythm: Normal rate and regular rhythm.     Heart sounds: Normal heart sounds. No murmur.  Pulmonary:     Effort: Pulmonary effort is normal.     Comments: Rhonchi L lung Abdominal:     General: Bowel sounds are normal. There is no distension.     Palpations: Abdomen is soft.     Tenderness: There is no abdominal tenderness.  Musculoskeletal:     Cervical back: Neck supple.     Right lower leg: No edema.     Left lower leg: No edema.  Skin:    General: Skin is warm and dry.  Neurological:     Mental Status: He is alert and oriented to person, place, and time.     Comments: Fluent speech  Psychiatric:        Judgment: Judgment normal.     ED Results / Procedures / Treatments   Labs (all labs ordered are listed, but only abnormal results are displayed) Labs Reviewed  CBC WITH DIFFERENTIAL/PLATELET - Abnormal; Notable for the following components:      Result Value   WBC 14.0 (*)    RBC 3.72 (*)    Hemoglobin 11.3 (*)    HCT 35.2 (*)    RDW 16.9 (*)    Neutro Abs 12.0 (*)    Abs Immature Granulocytes 0.31 (*)    All other components within normal limits  COMPREHENSIVE METABOLIC PANEL  LACTIC ACID, PLASMA  URINALYSIS, ROUTINE W REFLEX MICROSCOPIC    EKG None  Radiology DG Chest 2 View  Result Date:  12/17/2019 CLINICAL DATA:  Weakness, pneumonia, COVID negative EXAM: CHEST - 2 VIEW COMPARISON:  12/06/2019 FINDINGS: Very low lung volumes with bibasilar atelectasis. Stable heart size and vascularity. No large effusion or pneumothorax. Degenerative changes of the spine and atherosclerosis of aorta. IMPRESSION: Similar low lung volumes and bibasilar atelectasis pattern. Aortic atherosclerosis Electronically Signed   By: Jerilynn Mages.  Shick M.D.   On: 12/17/2019 14:44    Procedures Procedures (including  critical care time)  Medications Ordered in ED Medications - No data to display  ED Course  I have reviewed the triage vital signs and the nursing notes.  Pertinent labs & imaging results that were available during my care of the patient were reviewed by me and considered in my medical decision making (see chart for details).    MDM Rules/Calculators/A&P                      Patient was nontoxic in no respiratory distress on exam, stable vital signs.  100% on 2 L nasal cannula.  Chest x-ray with some atelectasis, no obvious infiltrate.  Lab work shows mild hyponatremia with sodium 130, glucose 153, creatinine 0.58, normal LFTs, WBC 14.0, normal lactate.  Because of the patient's abnormal lung exam and reports of cough and shortness of breath, will obtain CT of chest to evaluate for occult pneumonia.  I am signing out to the oncoming provider will follow up on imaging results. Final Clinical Impression(s) / ED Diagnoses Final diagnoses:  None    Rx / DC Orders ED Discharge Orders    None       Amarilis Belflower, Ambrose Finland, MD 12/17/19 1539

## 2019-12-17 NOTE — ED Triage Notes (Signed)
Pt family requested pt come to [facility]. Pt has been weak and had SOB recently. Dx with PNA in left lung today and tested negative for covid. Pt uses 2 lpm oxygen. 94-99% 134/76, 20 rr 94 hr. cbg 170  Hx of GERD, HTN, MI\, COPD

## 2019-12-17 NOTE — ED Notes (Signed)
Attempted a second line, but CT is going to be able to use existing line.

## 2019-12-17 NOTE — ED Notes (Signed)
[facility] called to transport pt 

## 2019-12-17 NOTE — ED Notes (Signed)
Pt placed on bedpan

## 2019-12-17 NOTE — ED Notes (Signed)
Pt transported to CT ?

## 2019-12-17 NOTE — ED Notes (Signed)
Patient verbalizes understanding of discharge instructions. Opportunity for questioning and answers were provided. Armband removed by staff, pt discharged from ED.  

## 2019-12-17 NOTE — ED Provider Notes (Signed)
Received the patient in signout from Dr. Clarene Duke.  Briefly the patient is a 86 year old male with a chief complaints of cough and shortness of breath.  Chronically on oxygen no significant trouble breathing here.  Chest x-ray without obvious cause.  Plan for CT scan of the chest to evaluate for occult pneumonia.  CT scan with small residual pleural fluid.  Not obviously pneumonia per radiology.  It sounds like clinically there was significant concern for pneumonia and so I will start him on antibiotics.  We will have him follow-up with his family doctor.   Melene Plan, DO 12/17/19 1643

## 2019-12-17 NOTE — ED Notes (Signed)
Pt removed from bed pan. Pt did not have BM at this time.

## 2019-12-19 DIAGNOSIS — J189 Pneumonia, unspecified organism: Secondary | ICD-10-CM | POA: Diagnosis not present

## 2019-12-19 DIAGNOSIS — R0602 Shortness of breath: Secondary | ICD-10-CM | POA: Diagnosis not present

## 2019-12-20 ENCOUNTER — Other Ambulatory Visit: Payer: Self-pay | Admitting: *Deleted

## 2019-12-20 NOTE — Patient Outreach (Signed)
Screened for potential [facility] Care Management needs as a benefit of  [employer].  Tyler Petersen is currently receiving skilled therapy at [facility].   Writer attended telephonic interdisciplinary team meeting to assess for disposition needs and transition plan for resident.   Facility reports member is from ALF. Reports family wants member to return to ALF upon SNF completion.   Member has peg tube feeds at night. Recent bout with respiratory failure that required intubation and pneumonia with extended hospitalization. Speech therapy is also working with member.    Facility reports member is motivated and family is very involved in his care and transition plans.   Will continue to follow  while member remains in SNF.    Raiford Noble, MSN-Ed, RN,BSN [facility] Post Acute Care Coordinator [phone] [facility]) [phone]  (Toll free office)

## 2019-12-22 DIAGNOSIS — E875 Hyperkalemia: Secondary | ICD-10-CM | POA: Diagnosis not present

## 2019-12-22 DIAGNOSIS — N179 Acute kidney failure, unspecified: Secondary | ICD-10-CM | POA: Diagnosis not present

## 2019-12-22 DIAGNOSIS — J189 Pneumonia, unspecified organism: Secondary | ICD-10-CM | POA: Diagnosis not present

## 2019-12-26 DIAGNOSIS — D72829 Elevated white blood cell count, unspecified: Secondary | ICD-10-CM | POA: Diagnosis not present

## 2019-12-26 DIAGNOSIS — I509 Heart failure, unspecified: Secondary | ICD-10-CM

## 2019-12-26 DIAGNOSIS — K219 Gastro-esophageal reflux disease without esophagitis: Secondary | ICD-10-CM

## 2019-12-26 DIAGNOSIS — A419 Sepsis, unspecified organism: Secondary | ICD-10-CM | POA: Diagnosis not present

## 2019-12-26 DIAGNOSIS — R739 Hyperglycemia, unspecified: Secondary | ICD-10-CM

## 2019-12-26 DIAGNOSIS — E875 Hyperkalemia: Secondary | ICD-10-CM

## 2019-12-26 DIAGNOSIS — R531 Weakness: Secondary | ICD-10-CM

## 2019-12-26 DIAGNOSIS — R799 Abnormal finding of blood chemistry, unspecified: Secondary | ICD-10-CM | POA: Diagnosis not present

## 2019-12-26 DIAGNOSIS — E860 Dehydration: Secondary | ICD-10-CM

## 2019-12-27 DIAGNOSIS — I509 Heart failure, unspecified: Secondary | ICD-10-CM | POA: Diagnosis not present

## 2019-12-27 DIAGNOSIS — F419 Anxiety disorder, unspecified: Secondary | ICD-10-CM | POA: Diagnosis present

## 2019-12-27 DIAGNOSIS — Z4682 Encounter for fitting and adjustment of non-vascular catheter: Secondary | ICD-10-CM | POA: Diagnosis not present

## 2019-12-27 DIAGNOSIS — E860 Dehydration: Secondary | ICD-10-CM | POA: Diagnosis present

## 2019-12-27 DIAGNOSIS — Z66 Do not resuscitate: Secondary | ICD-10-CM | POA: Diagnosis not present

## 2019-12-27 DIAGNOSIS — J90 Pleural effusion, not elsewhere classified: Secondary | ICD-10-CM | POA: Diagnosis not present

## 2019-12-27 DIAGNOSIS — B955 Unspecified streptococcus as the cause of diseases classified elsewhere: Secondary | ICD-10-CM | POA: Diagnosis present

## 2019-12-27 DIAGNOSIS — I469 Cardiac arrest, cause unspecified: Secondary | ICD-10-CM | POA: Diagnosis not present

## 2019-12-27 DIAGNOSIS — R7881 Bacteremia: Secondary | ICD-10-CM | POA: Diagnosis present

## 2019-12-27 DIAGNOSIS — J9600 Acute respiratory failure, unspecified whether with hypoxia or hypercapnia: Secondary | ICD-10-CM | POA: Diagnosis not present

## 2019-12-27 DIAGNOSIS — Z9911 Dependence on respirator [ventilator] status: Secondary | ICD-10-CM | POA: Diagnosis not present

## 2019-12-27 DIAGNOSIS — H44003 Unspecified purulent endophthalmitis, bilateral: Secondary | ICD-10-CM | POA: Diagnosis present

## 2019-12-27 DIAGNOSIS — D72829 Elevated white blood cell count, unspecified: Secondary | ICD-10-CM | POA: Diagnosis present

## 2019-12-27 DIAGNOSIS — R531 Weakness: Secondary | ICD-10-CM | POA: Diagnosis not present

## 2019-12-27 DIAGNOSIS — I472 Ventricular tachycardia: Secondary | ICD-10-CM | POA: Diagnosis not present

## 2019-12-27 DIAGNOSIS — D500 Iron deficiency anemia secondary to blood loss (chronic): Secondary | ICD-10-CM | POA: Diagnosis present

## 2019-12-27 DIAGNOSIS — F0390 Unspecified dementia without behavioral disturbance: Secondary | ICD-10-CM | POA: Diagnosis present

## 2019-12-27 DIAGNOSIS — K219 Gastro-esophageal reflux disease without esophagitis: Secondary | ICD-10-CM | POA: Diagnosis present

## 2019-12-27 DIAGNOSIS — I110 Hypertensive heart disease with heart failure: Secondary | ICD-10-CM | POA: Diagnosis present

## 2019-12-27 DIAGNOSIS — M1990 Unspecified osteoarthritis, unspecified site: Secondary | ICD-10-CM | POA: Diagnosis present

## 2019-12-27 DIAGNOSIS — D638 Anemia in other chronic diseases classified elsewhere: Secondary | ICD-10-CM | POA: Diagnosis present

## 2019-12-27 DIAGNOSIS — I5043 Acute on chronic combined systolic (congestive) and diastolic (congestive) heart failure: Secondary | ICD-10-CM | POA: Diagnosis not present

## 2019-12-27 DIAGNOSIS — S1983XS Other specified injuries of vocal cord, sequela: Secondary | ICD-10-CM | POA: Diagnosis not present

## 2019-12-27 DIAGNOSIS — E875 Hyperkalemia: Secondary | ICD-10-CM | POA: Diagnosis not present

## 2019-12-27 DIAGNOSIS — J939 Pneumothorax, unspecified: Secondary | ICD-10-CM | POA: Diagnosis not present

## 2019-12-27 DIAGNOSIS — R5381 Other malaise: Secondary | ICD-10-CM | POA: Diagnosis present

## 2019-12-27 DIAGNOSIS — R739 Hyperglycemia, unspecified: Secondary | ICD-10-CM | POA: Diagnosis not present

## 2019-12-27 DIAGNOSIS — E871 Hypo-osmolality and hyponatremia: Secondary | ICD-10-CM | POA: Diagnosis present

## 2019-12-27 DIAGNOSIS — I700 Atherosclerosis of aorta: Secondary | ICD-10-CM | POA: Diagnosis not present

## 2019-12-27 DIAGNOSIS — M7989 Other specified soft tissue disorders: Secondary | ICD-10-CM | POA: Diagnosis not present

## 2019-12-27 DIAGNOSIS — E440 Moderate protein-calorie malnutrition: Secondary | ICD-10-CM | POA: Diagnosis present

## 2019-12-27 DIAGNOSIS — K922 Gastrointestinal hemorrhage, unspecified: Secondary | ICD-10-CM | POA: Diagnosis present

## 2019-12-27 DIAGNOSIS — R1312 Dysphagia, oropharyngeal phase: Secondary | ICD-10-CM | POA: Diagnosis not present

## 2019-12-27 DIAGNOSIS — S270XXA Traumatic pneumothorax, initial encounter: Secondary | ICD-10-CM | POA: Diagnosis not present

## 2019-12-27 DIAGNOSIS — R4182 Altered mental status, unspecified: Secondary | ICD-10-CM | POA: Diagnosis not present

## 2019-12-27 DIAGNOSIS — I361 Nonrheumatic tricuspid (valve) insufficiency: Secondary | ICD-10-CM | POA: Diagnosis not present

## 2019-12-27 DIAGNOSIS — T148XXA Other injury of unspecified body region, initial encounter: Secondary | ICD-10-CM | POA: Diagnosis present

## 2019-12-27 DIAGNOSIS — M25421 Effusion, right elbow: Secondary | ICD-10-CM | POA: Diagnosis present

## 2019-12-30 ENCOUNTER — Inpatient Hospital Stay: Admission: AD | Admit: 2019-12-30 | Payer: Medicare Other | Source: Other Acute Inpatient Hospital | Admitting: Student

## 2019-12-30 DIAGNOSIS — I469 Cardiac arrest, cause unspecified: Secondary | ICD-10-CM

## 2019-12-30 DIAGNOSIS — J9600 Acute respiratory failure, unspecified whether with hypoxia or hypercapnia: Secondary | ICD-10-CM

## 2019-12-30 DIAGNOSIS — I361 Nonrheumatic tricuspid (valve) insufficiency: Secondary | ICD-10-CM

## 2019-12-30 DIAGNOSIS — I472 Ventricular tachycardia: Secondary | ICD-10-CM

## 2019-12-31 ENCOUNTER — Inpatient Hospital Stay (HOSPITAL_COMMUNITY)
Admission: AD | Admit: 2019-12-31 | Discharge: 2020-01-03 | DRG: 208 | Disposition: E | Payer: Medicare Other | Source: Other Acute Inpatient Hospital | Attending: Pulmonary Disease | Admitting: Pulmonary Disease

## 2019-12-31 ENCOUNTER — Inpatient Hospital Stay (HOSPITAL_COMMUNITY): Payer: Medicare Other

## 2019-12-31 DIAGNOSIS — I428 Other cardiomyopathies: Secondary | ICD-10-CM | POA: Diagnosis present

## 2019-12-31 DIAGNOSIS — I480 Paroxysmal atrial fibrillation: Secondary | ICD-10-CM | POA: Diagnosis present

## 2019-12-31 DIAGNOSIS — D509 Iron deficiency anemia, unspecified: Secondary | ICD-10-CM | POA: Diagnosis present

## 2019-12-31 DIAGNOSIS — D638 Anemia in other chronic diseases classified elsewhere: Secondary | ICD-10-CM | POA: Diagnosis present

## 2019-12-31 DIAGNOSIS — J9621 Acute and chronic respiratory failure with hypoxia: Secondary | ICD-10-CM | POA: Diagnosis present

## 2019-12-31 DIAGNOSIS — Z515 Encounter for palliative care: Secondary | ICD-10-CM | POA: Diagnosis not present

## 2019-12-31 DIAGNOSIS — Z20822 Contact with and (suspected) exposure to covid-19: Secondary | ICD-10-CM | POA: Diagnosis present

## 2019-12-31 DIAGNOSIS — R001 Bradycardia, unspecified: Secondary | ICD-10-CM | POA: Diagnosis present

## 2019-12-31 DIAGNOSIS — R7881 Bacteremia: Secondary | ICD-10-CM | POA: Diagnosis present

## 2019-12-31 DIAGNOSIS — Z931 Gastrostomy status: Secondary | ICD-10-CM

## 2019-12-31 DIAGNOSIS — I462 Cardiac arrest due to underlying cardiac condition: Secondary | ICD-10-CM | POA: Diagnosis present

## 2019-12-31 DIAGNOSIS — R739 Hyperglycemia, unspecified: Secondary | ICD-10-CM | POA: Diagnosis not present

## 2019-12-31 DIAGNOSIS — Z6825 Body mass index (BMI) 25.0-25.9, adult: Secondary | ICD-10-CM

## 2019-12-31 DIAGNOSIS — E860 Dehydration: Secondary | ICD-10-CM | POA: Diagnosis present

## 2019-12-31 DIAGNOSIS — I214 Non-ST elevation (NSTEMI) myocardial infarction: Secondary | ICD-10-CM | POA: Diagnosis not present

## 2019-12-31 DIAGNOSIS — N179 Acute kidney failure, unspecified: Secondary | ICD-10-CM | POA: Diagnosis present

## 2019-12-31 DIAGNOSIS — R1310 Dysphagia, unspecified: Secondary | ICD-10-CM | POA: Diagnosis present

## 2019-12-31 DIAGNOSIS — R0989 Other specified symptoms and signs involving the circulatory and respiratory systems: Secondary | ICD-10-CM | POA: Diagnosis not present

## 2019-12-31 DIAGNOSIS — I469 Cardiac arrest, cause unspecified: Secondary | ICD-10-CM | POA: Diagnosis present

## 2019-12-31 DIAGNOSIS — K922 Gastrointestinal hemorrhage, unspecified: Secondary | ICD-10-CM | POA: Diagnosis not present

## 2019-12-31 DIAGNOSIS — J9600 Acute respiratory failure, unspecified whether with hypoxia or hypercapnia: Secondary | ICD-10-CM

## 2019-12-31 DIAGNOSIS — E440 Moderate protein-calorie malnutrition: Secondary | ICD-10-CM | POA: Diagnosis present

## 2019-12-31 DIAGNOSIS — Z8249 Family history of ischemic heart disease and other diseases of the circulatory system: Secondary | ICD-10-CM

## 2019-12-31 DIAGNOSIS — Z7982 Long term (current) use of aspirin: Secondary | ICD-10-CM

## 2019-12-31 DIAGNOSIS — I5043 Acute on chronic combined systolic (congestive) and diastolic (congestive) heart failure: Secondary | ICD-10-CM | POA: Diagnosis present

## 2019-12-31 DIAGNOSIS — I5021 Acute systolic (congestive) heart failure: Secondary | ICD-10-CM

## 2019-12-31 DIAGNOSIS — I472 Ventricular tachycardia: Secondary | ICD-10-CM

## 2019-12-31 DIAGNOSIS — J9690 Respiratory failure, unspecified, unspecified whether with hypoxia or hypercapnia: Secondary | ICD-10-CM | POA: Diagnosis not present

## 2019-12-31 DIAGNOSIS — F0390 Unspecified dementia without behavioral disturbance: Secondary | ICD-10-CM | POA: Diagnosis present

## 2019-12-31 DIAGNOSIS — D62 Acute posthemorrhagic anemia: Secondary | ICD-10-CM | POA: Diagnosis not present

## 2019-12-31 DIAGNOSIS — E876 Hypokalemia: Secondary | ICD-10-CM | POA: Diagnosis present

## 2019-12-31 DIAGNOSIS — J9811 Atelectasis: Secondary | ICD-10-CM | POA: Diagnosis not present

## 2019-12-31 DIAGNOSIS — R54 Age-related physical debility: Secondary | ICD-10-CM | POA: Diagnosis present

## 2019-12-31 DIAGNOSIS — Z887 Allergy status to serum and vaccine status: Secondary | ICD-10-CM

## 2019-12-31 DIAGNOSIS — Z79899 Other long term (current) drug therapy: Secondary | ICD-10-CM

## 2019-12-31 DIAGNOSIS — F419 Anxiety disorder, unspecified: Secondary | ICD-10-CM | POA: Diagnosis present

## 2019-12-31 DIAGNOSIS — N400 Enlarged prostate without lower urinary tract symptoms: Secondary | ICD-10-CM | POA: Diagnosis present

## 2019-12-31 DIAGNOSIS — J939 Pneumothorax, unspecified: Secondary | ICD-10-CM | POA: Diagnosis present

## 2019-12-31 DIAGNOSIS — R579 Shock, unspecified: Secondary | ICD-10-CM | POA: Diagnosis present

## 2019-12-31 DIAGNOSIS — J90 Pleural effusion, not elsewhere classified: Secondary | ICD-10-CM | POA: Diagnosis not present

## 2019-12-31 DIAGNOSIS — I110 Hypertensive heart disease with heart failure: Secondary | ICD-10-CM | POA: Diagnosis present

## 2019-12-31 DIAGNOSIS — I509 Heart failure, unspecified: Secondary | ICD-10-CM | POA: Diagnosis not present

## 2019-12-31 DIAGNOSIS — Z66 Do not resuscitate: Secondary | ICD-10-CM | POA: Diagnosis present

## 2019-12-31 DIAGNOSIS — K5730 Diverticulosis of large intestine without perforation or abscess without bleeding: Secondary | ICD-10-CM | POA: Diagnosis present

## 2019-12-31 DIAGNOSIS — I429 Cardiomyopathy, unspecified: Secondary | ICD-10-CM

## 2019-12-31 DIAGNOSIS — Z4682 Encounter for fitting and adjustment of non-vascular catheter: Secondary | ICD-10-CM | POA: Diagnosis not present

## 2019-12-31 DIAGNOSIS — I252 Old myocardial infarction: Secondary | ICD-10-CM

## 2019-12-31 DIAGNOSIS — D649 Anemia, unspecified: Secondary | ICD-10-CM | POA: Diagnosis not present

## 2019-12-31 DIAGNOSIS — B951 Streptococcus, group B, as the cause of diseases classified elsewhere: Secondary | ICD-10-CM | POA: Diagnosis present

## 2019-12-31 DIAGNOSIS — K219 Gastro-esophageal reflux disease without esophagitis: Secondary | ICD-10-CM | POA: Diagnosis present

## 2019-12-31 DIAGNOSIS — J151 Pneumonia due to Pseudomonas: Secondary | ICD-10-CM | POA: Diagnosis present

## 2019-12-31 DIAGNOSIS — R627 Adult failure to thrive: Secondary | ICD-10-CM | POA: Diagnosis present

## 2019-12-31 DIAGNOSIS — Z9981 Dependence on supplemental oxygen: Secondary | ICD-10-CM

## 2019-12-31 DIAGNOSIS — K648 Other hemorrhoids: Secondary | ICD-10-CM | POA: Diagnosis present

## 2019-12-31 LAB — CBC WITH DIFFERENTIAL/PLATELET
Abs Immature Granulocytes: 0.4 10*3/uL — ABNORMAL HIGH (ref 0.00–0.07)
Basophils Absolute: 0 10*3/uL (ref 0.0–0.1)
Basophils Relative: 0 %
Eosinophils Absolute: 0 10*3/uL (ref 0.0–0.5)
Eosinophils Relative: 0 %
HCT: 31.8 % — ABNORMAL LOW (ref 39.0–52.0)
Hemoglobin: 9.7 g/dL — ABNORMAL LOW (ref 13.0–17.0)
Lymphocytes Relative: 6 %
Lymphs Abs: 1.3 10*3/uL (ref 0.7–4.0)
MCH: 29.3 pg (ref 26.0–34.0)
MCHC: 30.5 g/dL (ref 30.0–36.0)
MCV: 96.1 fL (ref 80.0–100.0)
Monocytes Absolute: 0.4 10*3/uL (ref 0.1–1.0)
Monocytes Relative: 2 %
Myelocytes: 1 %
Neutro Abs: 20 10*3/uL — ABNORMAL HIGH (ref 1.7–7.7)
Neutrophils Relative %: 90 %
Platelets: 263 10*3/uL (ref 150–400)
Promyelocytes Relative: 1 %
RBC: 3.31 MIL/uL — ABNORMAL LOW (ref 4.22–5.81)
RDW: 17.5 % — ABNORMAL HIGH (ref 11.5–15.5)
WBC: 22.2 10*3/uL — ABNORMAL HIGH (ref 4.0–10.5)
nRBC: 0 /100 WBC
nRBC: 0.2 % (ref 0.0–0.2)

## 2019-12-31 LAB — URINALYSIS, ROUTINE W REFLEX MICROSCOPIC
Bacteria, UA: NONE SEEN
Bilirubin Urine: NEGATIVE
Glucose, UA: NEGATIVE mg/dL
Ketones, ur: NEGATIVE mg/dL
Leukocytes,Ua: NEGATIVE
Nitrite: NEGATIVE
Protein, ur: 30 mg/dL — AB
Specific Gravity, Urine: 1.026 (ref 1.005–1.030)
pH: 7 (ref 5.0–8.0)

## 2019-12-31 LAB — COMPREHENSIVE METABOLIC PANEL
ALT: 34 U/L (ref 0–44)
AST: 24 U/L (ref 15–41)
Albumin: 1.7 g/dL — ABNORMAL LOW (ref 3.5–5.0)
Alkaline Phosphatase: 51 U/L (ref 38–126)
Anion gap: 10 (ref 5–15)
BUN: 22 mg/dL (ref 8–23)
CO2: 29 mmol/L (ref 22–32)
Calcium: 7.4 mg/dL — ABNORMAL LOW (ref 8.9–10.3)
Chloride: 107 mmol/L (ref 98–111)
Creatinine, Ser: 0.74 mg/dL (ref 0.61–1.24)
GFR calc Af Amer: >60 mL/min (ref >60)
GFR calc non Af Amer: >60 mL/min (ref >60)
Glucose, Bld: 148 mg/dL — ABNORMAL HIGH (ref 70–99)
Potassium: 4.2 mmol/L (ref 3.5–5.1)
Sodium: 146 mmol/L — ABNORMAL HIGH (ref 135–145)
Total Bilirubin: 0.7 mg/dL (ref 0.3–1.2)
Total Protein: 4.1 g/dL — ABNORMAL LOW (ref 6.5–8.1)

## 2019-12-31 LAB — POCT I-STAT 7, (LYTES, BLD GAS, ICA,H+H)
Acid-Base Excess: 8 mmol/L — ABNORMAL HIGH (ref 0.0–2.0)
Acid-Base Excess: 7 mmol/L — ABNORMAL HIGH (ref 0.0–2.0)
Bicarbonate: 30.9 mmol/L — ABNORMAL HIGH (ref 20.0–28.0)
Bicarbonate: 30.6 mmol/L — ABNORMAL HIGH (ref 20.0–28.0)
Calcium, Ion: 1.11 mmol/L — ABNORMAL LOW (ref 1.15–1.40)
Calcium, Ion: 1.16 mmol/L (ref 1.15–1.40)
HCT: 28 % — ABNORMAL LOW (ref 39.0–52.0)
HCT: 30 % — ABNORMAL LOW (ref 39.0–52.0)
Hemoglobin: 9.5 g/dL — ABNORMAL LOW (ref 13.0–17.0)
Hemoglobin: 10.2 g/dL — ABNORMAL LOW (ref 13.0–17.0)
O2 Saturation: 97 %
O2 Saturation: 95 %
Patient temperature: 97.2
Patient temperature: 37
Potassium: 3.6 mmol/L (ref 3.5–5.1)
Potassium: 3.5 mmol/L (ref 3.5–5.1)
Sodium: 145 mmol/L (ref 135–145)
Sodium: 146 mmol/L — ABNORMAL HIGH (ref 135–145)
TCO2: 32 mmol/L (ref 22–32)
TCO2: 32 mmol/L (ref 22–32)
pCO2 arterial: 32.7 mmHg (ref 32.0–48.0)
pCO2 arterial: 38.8 mmHg (ref 32.0–48.0)
pH, Arterial: 7.581 — ABNORMAL HIGH (ref 7.350–7.450)
pH, Arterial: 7.505 — ABNORMAL HIGH (ref 7.350–7.450)
pO2, Arterial: 73 mmHg — ABNORMAL LOW (ref 83.0–108.0)
pO2, Arterial: 71 mmHg — ABNORMAL LOW (ref 83.0–108.0)

## 2019-12-31 LAB — TROPONIN I (HIGH SENSITIVITY)
Troponin I (High Sensitivity): 792 ng/L (ref <18)
Troponin I (High Sensitivity): 696 ng/L (ref <18)
Troponin I (High Sensitivity): 664 ng/L (ref <18)
Troponin I (High Sensitivity): 605 ng/L (ref <18)

## 2019-12-31 LAB — MRSA PCR SCREENING: MRSA by PCR: POSITIVE — AB

## 2019-12-31 LAB — PROCALCITONIN: Procalcitonin: 0.2 ng/mL

## 2019-12-31 LAB — TRIGLYCERIDES: Triglycerides: 142 mg/dL (ref <150)

## 2019-12-31 LAB — MAGNESIUM: Magnesium: 2.4 mg/dL (ref 1.7–2.4)

## 2019-12-31 LAB — LACTIC ACID, PLASMA
Lactic Acid, Venous: 1.7 mmol/L (ref 0.5–1.9)
Lactic Acid, Venous: 1.4 mmol/L (ref 0.5–1.9)

## 2019-12-31 LAB — GLUCOSE, CAPILLARY
Glucose-Capillary: 131 mg/dL — ABNORMAL HIGH (ref 70–99)
Glucose-Capillary: 134 mg/dL — ABNORMAL HIGH (ref 70–99)
Glucose-Capillary: 106 mg/dL — ABNORMAL HIGH (ref 70–99)

## 2019-12-31 LAB — ECHOCARDIOGRAM COMPLETE: Weight: 2641.99 oz

## 2019-12-31 LAB — STREP PNEUMONIAE URINARY ANTIGEN: Strep Pneumo Urinary Antigen: NEGATIVE

## 2019-12-31 LAB — PHOSPHORUS: Phosphorus: 2.5 mg/dL (ref 2.5–4.6)

## 2019-12-31 LAB — CORTISOL: Cortisol, Plasma: 6.1 ug/dL

## 2019-12-31 LAB — PROTIME-INR
INR: 1.2 (ref 0.8–1.2)
Prothrombin Time: 14.4 seconds (ref 11.4–15.2)

## 2019-12-31 LAB — SARS CORONAVIRUS 2 BY RT PCR (HOSPITAL ORDER, PERFORMED IN ~~LOC~~ HOSPITAL LAB): SARS Coronavirus 2: NEGATIVE

## 2019-12-31 LAB — BRAIN NATRIURETIC PEPTIDE: B Natriuretic Peptide: 775.7 pg/mL — ABNORMAL HIGH (ref 0.0–100.0)

## 2019-12-31 MED ORDER — CHLORHEXIDINE GLUCONATE 0.12% ORAL RINSE (MEDLINE KIT)
15.0000 mL | Freq: Two times a day (BID) | OROMUCOSAL | Status: DC
  Administered 2019-12-31 – 2020-01-01 (×2): 15 mL via OROMUCOSAL

## 2019-12-31 MED ORDER — DOPAMINE-DEXTROSE 3.2-5 MG/ML-% IV SOLN
0.0000 ug/kg/min | INTRAVENOUS | Status: DC
  Administered 2019-12-31: 5 ug/kg/min via INTRAVENOUS
  Filled 2019-12-31: qty 250

## 2019-12-31 MED ORDER — FENTANYL CITRATE (PF) 100 MCG/2ML IJ SOLN
25.0000 ug | INTRAMUSCULAR | Status: DC | PRN
  Administered 2019-12-31 – 2020-01-01 (×2): 100 ug via INTRAVENOUS
  Filled 2019-12-31: qty 2

## 2019-12-31 MED ORDER — HEPARIN SODIUM (PORCINE) 5000 UNIT/ML IJ SOLN
5000.0000 [IU] | Freq: Three times a day (TID) | INTRAMUSCULAR | Status: DC
  Administered 2019-12-31 – 2020-01-01 (×2): 5000 [IU] via SUBCUTANEOUS
  Filled 2019-12-31 (×2): qty 1

## 2019-12-31 MED ORDER — PANTOPRAZOLE SODIUM 40 MG IV SOLR
40.0000 mg | Freq: Every day | INTRAVENOUS | Status: DC
  Administered 2019-12-31: 40 mg via INTRAVENOUS
  Filled 2019-12-31: qty 40

## 2019-12-31 MED ORDER — PROPOFOL 1000 MG/100ML IV EMUL
5.0000 ug/kg/min | INTRAVENOUS | Status: DC
  Administered 2019-12-31: 10 ug/kg/min via INTRAVENOUS
  Administered 2020-01-01 (×3): 15 ug/kg/min via INTRAVENOUS
  Filled 2019-12-31 (×3): qty 100

## 2019-12-31 MED ORDER — MIDAZOLAM HCL 2 MG/2ML IJ SOLN
1.0000 mg | INTRAMUSCULAR | Status: DC | PRN
  Administered 2020-01-01: 1 mg via INTRAVENOUS
  Filled 2019-12-31 (×2): qty 2

## 2019-12-31 MED ORDER — ORAL CARE MOUTH RINSE
15.0000 mL | OROMUCOSAL | Status: DC
  Administered 2019-12-31 – 2020-01-01 (×9): 15 mL via OROMUCOSAL

## 2019-12-31 MED ORDER — FENTANYL CITRATE (PF) 100 MCG/2ML IJ SOLN
25.0000 ug | INTRAMUSCULAR | Status: DC | PRN
  Administered 2020-01-01: 25 ug via INTRAVENOUS

## 2019-12-31 MED ORDER — PIPERACILLIN-TAZOBACTAM 3.375 G IVPB
3.3750 g | Freq: Three times a day (TID) | INTRAVENOUS | Status: DC
  Administered 2019-12-31 – 2020-01-01 (×3): 3.375 g via INTRAVENOUS
  Filled 2019-12-31 (×6): qty 50

## 2019-12-31 MED ORDER — POLYETHYLENE GLYCOL 3350 17 G PO PACK: 17.0000 g | PACK | Freq: Every day | ORAL | Status: DC

## 2019-12-31 MED ORDER — DOCUSATE SODIUM 50 MG/5ML PO LIQD
100.0000 mg | Freq: Two times a day (BID) | ORAL | Status: DC
  Administered 2019-12-31: 100 mg via ORAL
  Filled 2019-12-31 (×2): qty 10

## 2019-12-31 MED ORDER — CHLORHEXIDINE GLUCONATE CLOTH 2 % EX PADS
6.0000 | MEDICATED_PAD | Freq: Every day | CUTANEOUS | Status: DC
  Administered 2019-12-31: 6 via TOPICAL

## 2019-12-31 MED ORDER — POLYETHYLENE GLYCOL 3350 17 G PO PACK: 17.0000 g | PACK | Freq: Every day | ORAL | Status: DC | PRN

## 2019-12-31 MED ORDER — DOCUSATE SODIUM 100 MG PO CAPS: 100.0000 mg | ORAL_CAPSULE | Freq: Two times a day (BID) | ORAL | Status: DC | PRN

## 2019-12-31 MED ORDER — FENTANYL 2500MCG IN NS 250ML (10MCG/ML) PREMIX INFUSION
25.0000 ug/h | INTRAVENOUS | Status: DC
  Administered 2019-12-31 – 2020-01-01 (×3): 200 ug/h via INTRAVENOUS
  Filled 2019-12-31 (×2): qty 250

## 2019-12-31 MED ORDER — MIDAZOLAM HCL 2 MG/2ML IJ SOLN
1.0000 mg | INTRAMUSCULAR | Status: DC | PRN
  Administered 2019-12-31 – 2020-01-01 (×3): 1 mg via INTRAVENOUS
  Filled 2019-12-31 (×2): qty 2

## 2019-12-31 NOTE — Progress Notes (Signed)
RT attempted to get sputum culture. Patient was suctioned with nothing in return. Will leave sputum trap in-line.

## 2019-12-31 NOTE — H&P (Addendum)
NAME:  Tyler Petersen, MRN:  [id number], DOB:  Jun 05, 1929, LOS: 0 ADMISSION DATE:  12/12/2019, CONSULTATION DATE:  12/28/2019 REFERRING MD:  Joanette Gula, CHIEF COMPLAINT: Cardiac arrest  Brief History   86 year old nursing home resident with chronic medical illness as below, multiple recent admissions to [facility] for infections, pneumonia,   Admit to [place] on 5/24 with generalized weakness, severe dehydration, significant anemia with hemoglobin 7.5 on admission.  Subsequently Hb dropped to 4.9 on 5/26 with no frank bleed, Hemoccult positive stool.  Transfused 4 units PRBC and received IV iron.  Course complicated by profound leukocytosis group B strep bacteremia, for which he is on empiric Zosyn, hypokalemia, right elbow swelling with venous duplex negative for DVT  He had a cardiac arrest on 5/28 with asystole, left femoral line placed intubated and transferred to ICU.  Subsequently developed right pneumothorax requiring chest tube placement.  Transferred to [facility] for further management. During transport he was noted to be severely bradycardic and given 1 amp of atropine by [facility]  Past Medical History   Hypertension, myocardial infarction, A. fib, valvular heart disease, nursing home resident, frequent admissions to the hospital, vocal cord dysfunction secondary to prior intubation, anemia, BPH, UTI   has a past medical history of Acute blood loss anemia, Anxiety, BPH (benign prostatic hyperplasia), GI bleed, HTN (hypertension), Hyponatremia, NSTEMI (non-ST elevated myocardial infarction) (HCC), Osteoarthritis, Osteoarthritis, Rhabdomyolysis, and UTI (urinary tract infection).  Significant Hospital Events   5/24-admit to [facility] with weakness, dehydration, significant anemia s/p 4 units PRBC.  Awaiting transfer to different facility for GI eval. 5/28-asystolic cardiac arrest-Asytole then SVT and V. tach, pneumothorax post CPR requiring chest tube made DNR by family 5/29-transfer to  [facility] hospital.  Consults:    Procedures:  ET tube 5/28 > Femoral CVL 5/28 > Right chest tube 5/28 >  Significant Diagnostic Tests:  CT abdomen 12/27/2019-extensive subsegmental atelectasis in the lower lobes.  No acute findings in the abdomen  Right upper extremity 12/27/2019-negative for DVT  Micro Data:  MRSA screen 12/26/2019-negative Blood culture 11/26/2019-strep agalactiae group 3  Antimicrobials:  Zosyn 5/29>>   Interim history/subjective:    Objective   SpO2 98 %.    Vent Mode: PRVC FiO2 (%):  [30 %] 30 % Set Rate:  [16 bmp] 16 bmp Vt Set:  [540 mL] 540 mL PEEP:  [5 cmH20] 5 cmH20  No intake or output data in the 24 hours ending 12/30/2019 1248 There were no vitals filed for this visit.  Examination: Blood pressure (!) 110/55, pulse (!) 53, temperature (!) 97.2 F (36.2 C), temperature source Axillary, resp. rate 16, weight 74.9 kg, SpO2 97 %. Gen:      Chronically ill-appearing,  HEENT:  EOMI, sclera anicteric, ETT Neck:     No masses; no thyromegaly Lungs:    Clear to auscultation bilaterally; normal respiratory effort CV:         Regular rate and rhythm; no murmurs Abd:      + bowel sounds; soft, non-tender; no palpable masses, no distension, PEG tube Ext:    No edema; adequate peripheral perfusion Skin:      Warm and dry; no rash Neuro: Sedated, unresponsive  Resolved Hospital Problem list     Assessment & Plan:  Acute hypoxic respiratory failure secondary to cardiac arrest  Right pneumothorax.   Chest tube to suction Continue full vent support Check chest x-ray, ABG  Cardiac arrest Appears to have been respiratory.  Initial rhythm was asystole.  Unclear how long  he was down As per EMS patient was responsive during transport Not a candidate for hypothermia Follow troponin, EKG  Leukocytosis, group B strep bacteremia 1 out of 4 at [place].  Possible contaminant Repeat cultures Follow continue broad antibiotic coverage  Generalized weakness,  dehydration IV fluid hydration  AKI Follow urine output and creatinine  Severe anemia of malnutrition, chronic disease, iron deficiency anemia.  No evidence of active bleed Continue monitoring  Acute on chronic systolic and diastolic heart failure Bradycardia- maybe sedation related.  Prior echo with EF 45-50%. Continue aspirin Hold Lasix Atropine as needed Can try dopamine instead of levo if HR continues to be low  Dysphagia Severe protein calorie malnutrition We will start tube feeds tomorrow  Vocal cord injury Patient apparently had prolonged intubation during previous admission with vocal cord injury and dysphagia with difficulty talking.  GERD Continue PPI  Dementia, failure to thrive Monitor neuro status  Goals of care Discussed with son Legrand Como over telephone.  Confirmed DNR status Told him that Mr. Trier is critically ill with very poor prognosis for recovery.  Recommended a time-limited trial of 48 hours.  We will need to reassess on Monday and consider comfort, palliation if there is no recovery. Legrand Como will discuss with his sister and get back to [place] about this.  Best practice:  Diet: NPO Pain/Anxiety/Delirium protocol (if indicated): Fentanyl,wean off propofol VAP protocol (if indicated): Ordered DVT prophylaxis: Heparin subcu GI prophylaxis: PPI Glucose control: Monitor Mobility: Bed Code Status: DNR Family Communication: Son updated. See above.  Disposition: ICU  Labs   CBC: No results for input(s): WBC, NEUTROABS, HGB, HCT, MCV, PLT in the last 168 hours.  Basic Metabolic Panel: No results for input(s): NA, K, CL, CO2, GLUCOSE, BUN, CREATININE, CALCIUM, MG, PHOS in the last 168 hours. GFR: Estimated Creatinine Clearance: 61.8 mL/min (A) (by C-G formula based on SCr of 0.58 mg/dL (L)). No results for input(s): PROCALCITON, WBC, LATICACIDVEN in the last 168 hours.  Liver Function Tests: No results for input(s): AST, ALT, ALKPHOS, BILITOT, PROT,  ALBUMIN in the last 168 hours. No results for input(s): LIPASE, AMYLASE in the last 168 hours. No results for input(s): AMMONIA in the last 168 hours.  ABG No results found for: PHART, PCO2ART, PO2ART, HCO3, TCO2, ACIDBASEDEF, O2SAT   Coagulation Profile: No results for input(s): INR, PROTIME in the last 168 hours.  Cardiac Enzymes: No results for input(s): CKTOTAL, CKMB, CKMBINDEX, TROPONINI in the last 168 hours.  HbA1C: No results found for: HGBA1C  CBG: No results for input(s): GLUCAP in the last 168 hours.  Review of Systems:   Unable to obtain as patient is intubated.  Past Medical History  He,  has a past medical history of Acute blood loss anemia, Anxiety, BPH (benign prostatic hyperplasia), GI bleed, HTN (hypertension), Hyponatremia, NSTEMI (non-ST elevated myocardial infarction) ([place]), Osteoarthritis, Osteoarthritis, Rhabdomyolysis, and UTI (urinary tract infection).   Surgical History    Past Surgical History:  Procedure Laterality Date   COLONOSCOPY WITH PROPOFOL N/A 03/09/2019   Procedure: COLONOSCOPY WITH PROPOFOL;  Surgeon: Jerene Bears, MD;  Location[facility] ENDOSCOPY;  Service: Gastroenterology;  Laterality: N/A;   ESOPHAGOGASTRODUODENOSCOPY (EGD) WITH PROPOFOL N/A 03/08/2019   Procedure: ESOPHAGOGASTRODUODENOSCOPY (EGD) WITH PROPOFOL;  Surgeon: Jerene Bears, MD;  Location: [place];  Service: Gastroenterology;  Laterality: N/A;   ORIF right pelvic/hip  right     Social History   reports that he has never smoked. He has never used smokeless tobacco. He reports previous alcohol  use. He reports previous drug use.   Family History   His family history is not on file.   Allergies Allergies  Allergen Reactions   Influenza Vaccines     Unknown     Home Medications  Prior to Admission medications   Medication Sig Start Date End Date Taking? Authorizing Provider  acetaminophen (TYLENOL) 325 MG tablet Take 650 mg by mouth every 6 (six) hours as needed.     [provider]  amLODipine (NORVASC) 10 MG tablet Take 10 mg by mouth every Monday, Wednesday, and Friday.  01/16/19   [provider]  aspirin EC 81 MG tablet Take 81 mg by mouth daily.    [provider]  bisacodyl (DULCOLAX) 10 MG suppository Place 10 mg rectally as needed for mild constipation, moderate constipation or severe constipation.    [provider]  doxycycline (VIBRAMYCIN) 100 MG capsule Take 1 capsule (100 mg total) by mouth 2 (two) times daily. 12/17/19   Melene Plan, DO  finasteride (PROSCAR) 5 MG tablet Take 5 mg by mouth daily.    [provider]  FLUoxetine (PROZAC) 10 MG capsule Take 10 mg by mouth daily.    [provider]  losartan (COZAAR) 50 MG tablet Take 50 mg by mouth daily.    [provider]  magnesium hydroxide (MILK OF MAGNESIA) 400 MG/5ML suspension Take 30 mLs by mouth daily as needed for mild constipation or moderate constipation.    [provider]  OXYGEN Inhale 2 L into the lungs continuous.    [provider]  pantoprazole (PROTONIX) 40 MG tablet Take 40 mg by mouth daily.     [provider]  tamsulosin (FLOMAX) 0.4 MG CAPS capsule Take 0.4 mg by mouth.    [provider]  traZODone (DESYREL) 50 MG tablet Take 50 mg by mouth at bedtime. 02/10/19   [provider]    Critical care time:   The patient is critically ill with multiple organ system failure and requires high complexity decision making for assessment and support, frequent evaluation and titration of therapies, advanced monitoring, review of radiographic studies and interpretation of complex data.   Critical Care Time devoted to patient care services, exclusive of separately billable procedures, described in this note is 120 minutes.   Chilton Greathouse MD  Pulmonary and Critical Care Please see [email or web address] for pager details.  12/22/2019, 1:16 PM

## 2019-12-31 NOTE — Consult Note (Signed)
Cardiology Consultation:  Patient ID: Tyler Petersen MRN: [id number]; DOB: 14-Nov-1928  Admit date: 12/15/2019 Date of Consult: 12/15/2019  Primary Care Provider: Maryella Shivers, MD Primary Cardiologist: No primary care provider on file.  Patient Profile:  Tyler Petersen is a 86 y.o. male with a hx of pneumonia, advanced age, frailty, hypertension, atrial fibrillation who is being seen today for the evaluation of elevated troponin/abnormal echocardiogram at the request of [name], MD.  History of Present Illness:  Tyler Petersen presents as a direct transfer from [facility].  He suffered a cardiac arrest on 12/30/2019.  Apparently, Tyler Petersen was admitted to [facility] on 5/24 for dehydration and a newly diagnosed anemia.  He had been living in a SNF.  I did discuss with his son Darrek Leasure ([phone]) the details of that admission.  Apparently Tyler Petersen had been diagnosed with pneumonia in late April early May at [facility].  He had a prolonged course for which he was ventilated.  He apparently required PEG tube placement.  He was then discharged to a skilled nursing facility.  Apparently has not been that active at the facility.  He does require a lot of assistance.  Apparently he has had a steady decline over the past 1 year.  This was related to her prior fall.  Other medical history is contributory for possible prior MI although his son reports he is unaware of this.  Also atrial fibrillation and appears.  He was admitted to [facility] again on 12/26/2019 for dehydration and new onset anemia.  His hemoglobin was down to 4.9 he did receive 4 units of packed red blood cells.  Apparently there was a plan to transfer him here to [facility] for gastroenterology evaluation.  Apparently he was going to perform a CT scan and he suffered a cardiac arrest.  This was witnessed by staff and reported to be a hypoxic respiratory  arrest.  The initial rhythm was reported to be PEA.  He then suffered what appears to be V. tach arrest.  He arrived to [facility] intubated.  He is awake on the ventilator and following commands.  Hypothermia protocol was not pursued.  Current vitals show temperature 96.8, heart rate in the 60s, respiratory rate 14, blood pressure 147/63.  He is on broad-spectrum antibiotics.  He is on a dopamine infusion.  Labs are significant for a creatinine of 0.74, BNP 775, troponin 792->696.  Hemoglobin value 9.5.  Lactic acid 1.7 and 1.2 on repeat.  He does have a leukocytosis of 22,000.  Chest x-ray demonstrates bilateral pleural effusions.  On my exam he has diminished breath sounds bilaterally.  He will follow commands.  He remains on the ventilator but will wake up and move extremities spontaneously as well as with command.  Heart Pathway Score:       Past Medical History: Past Medical History:  Diagnosis Date  . Acute blood loss anemia   . Anxiety   . BPH (benign prostatic hyperplasia)   . GI bleed   . HTN (hypertension)   . Hyponatremia   . NSTEMI (non-ST elevated myocardial infarction) ([place])   . Osteoarthritis   . Osteoarthritis   . Rhabdomyolysis   . UTI (urinary tract infection)     Past Surgical History: Past Surgical History:  Procedure Laterality Date  . COLONOSCOPY WITH PROPOFOL N/A 03/09/2019   Procedure: COLONOSCOPY WITH PROPOFOL;  Surgeon: Jerene Bears, MD;  Location: [place];  Service: Gastroenterology;  Laterality: N/A;  . ESOPHAGOGASTRODUODENOSCOPY (EGD) WITH PROPOFOL N/A 03/08/2019   Procedure: ESOPHAGOGASTRODUODENOSCOPY (EGD) WITH PROPOFOL;  Surgeon: Jerene Bears, MD;  Location: [facility] ENDOSCOPY;  Service: Gastroenterology;  Laterality: N/A;  . ORIF right pelvic/hip  right     Home Medications:  Prior to Admission medications   Medication Sig Start Date End Date Taking? Authorizing Provider  acetaminophen (TYLENOL) 325 MG tablet Take 650 mg by mouth every 6  (six) hours as needed.    [provider]  amLODipine (NORVASC) 10 MG tablet Take 10 mg by mouth every Monday, Wednesday, and Friday.  01/16/19   [provider]  aspirin EC 81 MG tablet Take 81 mg by mouth daily.    [provider]  bisacodyl (DULCOLAX) 10 MG suppository Place 10 mg rectally as needed for mild constipation, moderate constipation or severe constipation.    [provider]  doxycycline (VIBRAMYCIN) 100 MG capsule Take 1 capsule (100 mg total) by mouth 2 (two) times daily. 12/17/19   Deno Etienne, DO  finasteride (PROSCAR) 5 MG tablet Take 5 mg by mouth daily.    [provider]  FLUoxetine (PROZAC) 10 MG capsule Take 10 mg by mouth daily.    [provider]  losartan (COZAAR) 50 MG tablet Take 50 mg by mouth daily.    [provider]  magnesium hydroxide (MILK OF MAGNESIA) 400 MG/5ML suspension Take 30 mLs by mouth daily as needed for mild constipation or moderate constipation.    [provider]  OXYGEN Inhale 2 L into the lungs continuous.    [provider]  pantoprazole (PROTONIX) 40 MG tablet Take 40 mg by mouth daily.     [provider]  tamsulosin (FLOMAX) 0.4 MG CAPS capsule Take 0.4 mg by mouth.    [provider]  traZODone (DESYREL) 50 MG tablet Take 50 mg by mouth at bedtime. 02/10/19   [provider]    Inpatient Medications: Scheduled Meds: . chlorhexidine gluconate (MEDLINE KIT)  15 mL Mouth Rinse BID  . Chlorhexidine Gluconate Cloth  6 each Topical Daily  . docusate  100 mg Oral BID  . heparin  5,000 Units Subcutaneous Q8H  . mouth rinse  15 mL Mouth Rinse 10 times per day  . pantoprazole (PROTONIX) IV  40 mg Intravenous QHS  . polyethylene glycol  17 g Oral Daily   Continuous Infusions: . DOPamine 5 mcg/kg/min (12/06/2019 1444)  . fentaNYL infusion INTRAVENOUS 200 mcg/hr (12/13/2019 1557)  . piperacillin-tazobactam (ZOSYN)  IV 3.375 g (12/29/2019 1438)  .  propofol (DIPRIVAN) infusion 10 mcg/kg/min (12/26/2019 1555)   PRN Meds: docusate sodium, fentaNYL (SUBLIMAZE) injection, fentaNYL (SUBLIMAZE) injection, midazolam, midazolam, polyethylene glycol  Allergies:    Allergies  Allergen Reactions  . Influenza Vaccines     Unknown    Social History:   Social History   Socioeconomic History  . Marital status: Widowed    Spouse name: Not on file  . Number of children: Not on file  . Years of education: Not on file  . Highest education level: Not on file  Occupational History  . Not on file  Tobacco Use  . Smoking status: Never Smoker  . Smokeless tobacco: Never Used  Substance and Sexual Activity  . Alcohol use: Not Currently  . Drug use: Not Currently  . Sexual activity: Not Currently  Other Topics Concern  . Not on file  Social History Narrative  . Not on file   Social Determinants of Health  Financial Resource Strain:   . Difficulty of Paying Living Expenses:   Food Insecurity:   . Worried About Charity fundraiser in the Last Year:   . Arboriculturist in the Last Year:   Transportation Needs:   . Film/video editor (Medical):   Marland Kitchen Lack of Transportation (Non-Medical):   Physical Activity:   . Days of Exercise per Week:   . Minutes of Exercise per Session:   Stress:   . Feeling of Stress :   Social Connections:   . Frequency of Communication with Friends and Family:   . Frequency of Social Gatherings with Friends and Family:   . Attends Religious Services:   . Active Member of Clubs or Organizations:   . Attends Archivist Meetings:   Marland Kitchen Marital Status:   Intimate Partner Violence:   . Fear of Current or Ex-Partner:   . Emotionally Abused:   Marland Kitchen Physically Abused:   . Sexually Abused:      Family History:   HTN-mother   ROS:  All other ROS reviewed and negative. Pertinent positives noted in the HPI.     Physical Exam/Data:   Vitals:   12/10/2019 1300 12/18/2019 1315 12/11/2019 1630 12/28/2019 1645   BP: 121/67 (!) 110/55 (!) 109/55 (!) 147/63  Pulse: (!) 55 (!) 53 (!) 47 60  Resp: '16 16 14 14  ' Temp:   (!) 97 F (36.1 C) (!) 96.8 F (36 C)  TempSrc:      SpO2: 99% 97% 96% 96%  Weight:       No intake or output data in the 24 hours ending 12/24/2019 1705  Last 3 Weights 12/20/2019 12/17/2019 03/11/2019  Weight (lbs) 165 lb 2 oz 155 lb 176 lb  Weight (kg) 74.9 kg 70.308 kg 79.833 kg    Body mass index is 25.11 kg/m.   General: Intubated on vent, will follow commands Head: Atraumatic, normal size  Eyes: PEERLA, EOMI  Neck: Supple, no JVD Endocrine: No thryomegaly Cardiac: Normal S1, S2; RRR; no murmurs, rubs, or gallops Lungs: Rales and rhonchi bilaterally Abd: Soft, nontender, no hepatomegaly  Ext: Trace edema Musculoskeletal: No deformities, BUE and BLE strength normal and equal Skin: Warm and dry, no rashes   Neuro: Sedated on vent, will follow commands  EKG:  The EKG was personally reviewed and demonstrates: Sinus bradycardia, heart rate 48, old anteroseptal infarct, anterolateral T wave inversions Telemetry:  Telemetry was personally reviewed and demonstrates: Mainly sinus bradycardia, has had intermittent atrial fibrillation  Relevant CV Studies: TTE 12/14/2019 1. EF severely reduced, 25%. All mid and apical segments, and apex are  akinetic. There is apical ballooning with basal hyperkinesis. Findings  could represent a stress-induced cardiomyopathy vs large wrap around LAD  infarction. Clinical correlation is  recommended.  2. Left ventricular ejection fraction, by estimation, is 25 to 30%. The  left ventricle has severely decreased function. The left ventricle  demonstrates regional wall motion abnormalities (see scoring  diagram/findings for description). There is mild  concentric left ventricular hypertrophy. Left ventricular diastolic  parameters are consistent with Grade I diastolic dysfunction (impaired  relaxation).  3. Right ventricular systolic function is  normal. The right ventricular  size is normal. Tricuspid regurgitation signal is inadequate for assessing  PA pressure.  4. The mitral valve is degenerative. Trivial mitral valve regurgitation.  No evidence of mitral stenosis.  5. The aortic valve is tricuspid. Aortic valve regurgitation is not  visualized. Mild aortic valve sclerosis is present,  with no evidence of  aortic valve stenosis.   Laboratory Data: High Sensitivity Troponin:   Recent Labs  Lab 12/16/2019 1405 12/05/2019 1505  TROPONINIHS 792* 696*     Cardiac EnzymesNo results for input(s): TROPONINI in the last 168 hours. No results for input(s): TROPIPOC in the last 168 hours.  Chemistry Recent Labs  Lab 12/10/2019 1405 12/21/2019 1427  NA 146* 145  K 4.2 3.6  CL 107  --   CO2 29  --   GLUCOSE 148*  --   BUN 22  --   CREATININE 0.74  --   CALCIUM 7.4*  --   GFRNONAA >60  --   GFRAA >60  --   ANIONGAP 10  --     Recent Labs  Lab 12/30/2019 1405  PROT 4.1*  ALBUMIN 1.7*  AST 24  ALT 34  ALKPHOS 51  BILITOT 0.7   Hematology Recent Labs  Lab 12/18/2019 1405 12/25/2019 1427  WBC 22.2*  --   RBC 3.31*  --   HGB 9.7* 9.5*  HCT 31.8* 28.0*  MCV 96.1  --   MCH 29.3  --   MCHC 30.5  --   RDW 17.5*  --   PLT 263  --    BNP Recent Labs  Lab 12/14/2019 1405  BNP 775.7*    DDimer No results for input(s): DDIMER in the last 168 hours.  Radiology/Studies:  DG Chest Port 1 View  Result Date: 12/16/2019 CLINICAL DATA:  Hypoxia EXAM: PORTABLE CHEST 1 VIEW COMPARISON:  Dec 31, 2019 study obtained earlier in the day FINDINGS: Endotracheal tube tip is 3.0 cm above the carina. Chest tube on the right unchanged in position. No pneumothorax. Subcutaneous air noted on the right inferolaterally. There are bilateral pleural effusions. There is right base atelectasis. Heart size and pulmonary vascularity within normal limits. No adenopathy. No bone lesions. IMPRESSION: Tube positions as described without evident pneumothorax.  Pleural effusions bilaterally with right base atelectasis. Stable cardiac silhouette. Electronically Signed   By: Lowella Grip III M.D.   On: 12/03/2019 15:04   ECHOCARDIOGRAM COMPLETE  Result Date: 12/15/2019    ECHOCARDIOGRAM REPORT   Patient Name:   TEYON ODETTE Breach Date of Exam: 12/11/2019 Medical Rec #:  [id number]           Height:       68.0 in Accession #:    [id number]          Weight:       165.1 lb Date of Birth:  1928-08-13          BSA:          1.884 m Patient Age:    54 years            BP:           110/55 mmHg Patient Gender: M                   HR:           53 bpm. Exam Location:  Inpatient Procedure: 2D Echo                       STAT ECHO Reported to: Dr Lyman Bishop on 12/27/2019 3:42:00 PM. Indications:    Cardiac arrest  History:        Patient has no prior history of Echocardiogram examinations.  Previous Myocardial Infarction, Arrythmias:Atrial Fibrillation;                 Risk Factors:Hypertension.  Sonographer:    Clayton Lefort RDCS (AE) Referring Phys: [id number] [facility]  Sonographer Comments: Drainage tube and bandages in subcostal area. IMPRESSIONS  1. EF severely reduced, 25%. All mid and apical segments, and apex are akinetic. There is apical ballooning with basal hyperkinesis. Findings could represent a stress-induced cardiomyopathy vs large wrap around LAD infarction. Clinical correlation is recommended.  2. Left ventricular ejection fraction, by estimation, is 25 to 30%. The left ventricle has severely decreased function. The left ventricle demonstrates regional wall motion abnormalities (see scoring diagram/findings for description). There is mild concentric left ventricular hypertrophy. Left ventricular diastolic parameters are consistent with Grade I diastolic dysfunction (impaired relaxation).  3. Right ventricular systolic function is normal. The right ventricular size is normal. Tricuspid regurgitation signal is inadequate for assessing PA  pressure.  4. The mitral valve is degenerative. Trivial mitral valve regurgitation. No evidence of mitral stenosis.  5. The aortic valve is tricuspid. Aortic valve regurgitation is not visualized. Mild aortic valve sclerosis is present, with no evidence of aortic valve stenosis. FINDINGS  Left Ventricle: Left ventricular ejection fraction, by estimation, is 25 to 30%. The left ventricle has severely decreased function. The left ventricle demonstrates regional wall motion abnormalities. The left ventricular internal cavity size was normal  in size. There is mild concentric left ventricular hypertrophy. Left ventricular diastolic parameters are consistent with Grade I diastolic dysfunction (impaired relaxation). Normal left ventricular filling pressure.  LV Wall Scoring: The mid and distal lateral wall, mid and distal anterior septum, and entire apex are akinetic. Right Ventricle: The right ventricular size is normal. No increase in right ventricular wall thickness. Right ventricular systolic function is normal. Tricuspid regurgitation signal is inadequate for assessing PA pressure. Left Atrium: Left atrial size was normal in size. Right Atrium: Right atrial size was normal in size. Pericardium: Trivial pericardial effusion is present. Presence of pericardial fat pad. Mitral Valve: The mitral valve is degenerative in appearance. Trivial mitral valve regurgitation. No evidence of mitral valve stenosis. Tricuspid Valve: The tricuspid valve is grossly normal. Tricuspid valve regurgitation is not demonstrated. No evidence of tricuspid stenosis. Aortic Valve: The aortic valve is tricuspid. Aortic valve regurgitation is not visualized. Mild aortic valve sclerosis is present, with no evidence of aortic valve stenosis. Aortic valve mean gradient measures 3.0 mmHg. Aortic valve peak gradient measures 6.8 mmHg. Aortic valve area, by VTI measures 1.88 cm. Pulmonic Valve: The pulmonic valve was grossly normal. Pulmonic valve  regurgitation is not visualized. No evidence of pulmonic stenosis. Aorta: The aortic root and ascending aorta are structurally normal, with no evidence of dilitation. Venous: IVC assessment for right atrial pressure unable to be performed due to mechanical ventilation. IAS/Shunts: The atrial septum is grossly normal.  LEFT VENTRICLE PLAX 2D LVIDd:         4.20 cm     Diastology LVIDs:         3.50 cm     LV e' medial:   6.09 cm/s LV PW:         1.30 cm     LV E/e' medial: 16.6 LV IVS:        1.30 cm LVOT diam:     1.90 cm LV SV:         38 LV SV Index:   20 LVOT Area:     2.84 cm  LV  Volumes (MOD) LV vol d, MOD A2C: 64.3 ml LV vol d, MOD A4C: 81.8 ml LV vol s, MOD A2C: 34.2 ml LV vol s, MOD A4C: 56.6 ml LV SV MOD A2C:     30.1 ml LV SV MOD A4C:     81.8 ml LV SV MOD BP:      28.2 ml RIGHT VENTRICLE RV Basal diam:  2.60 cm RV S prime:     12.33 cm/s TAPSE (M-mode): 1.2 cm LEFT ATRIUM             Index       RIGHT ATRIUM          Index LA diam:        3.00 cm 1.59 cm/m  RA Area:     8.49 cm LA Vol (A2C):   54.1 ml 28.72 ml/m RA Volume:   15.10 ml 8.02 ml/m LA Vol (A4C):   48.3 ml 25.64 ml/m LA Biplane Vol: 52.8 ml 28.03 ml/m  AORTIC VALVE AV Area (Vmax):    1.47 cm AV Area (Vmean):   1.54 cm AV Area (VTI):     1.88 cm AV Vmax:           130.00 cm/s AV Vmean:          80.900 cm/s AV VTI:            0.204 m AV Peak Grad:      6.8 mmHg AV Mean Grad:      3.0 mmHg LVOT Vmax:         67.20 cm/s LVOT Vmean:        43.900 cm/s LVOT VTI:          0.135 m LVOT/AV VTI ratio: 0.66  AORTA Ao Root diam: 3.80 cm Ao Asc diam:  3.10 cm MV E velocity: 100.99 cm/s MV A velocity: 104.79 cm/s  SHUNTS MV E/A ratio:  0.96         Systemic VTI:  0.14 m                             Systemic Diam: 1.90 cm Eleonore Chiquito MD Electronically signed by Eleonore Chiquito MD Signature Date/Time: 12/25/2019/4:09:33 PM    Final     Assessment and Plan:   1. Acute Systolic HF, 50-27% with WMA/PEA arrest/VT arrest?/Non-STEMI -Echocardiogram  demonstrates profound regional wall motion abnormalities.  The mid anterior septal wall, all apical segments, entire apex are akinetic.  There is basal hypokinesis.  This does span multiple coronary artery distributions.  His troponin is mildly elevated.  His EKG demonstrates an old anteroseptal infarct as well as anterior lateral T wave inversions.   -When I did review his prior EKGs it appears the old anteroseptal infarct was present on the EKG dated 12/20/2019.  The only new finding are the anterolateral T wave inversions. High-sensitivity troponin was 792 and is trending down.  BNP elevated 775. -He did have a cardiac arrest that appears to have been respiratory driven.  Initial rhythm was PEA.  There is reported VT. He was initially admitted for profound anemia (Hgb ~4.9).  His hemoglobin in the emergency room here on 12/20/2019 was 11.  He was given 4 units of packed red blood cells.  He was given considerable volume and this could have placed him into circulatory overload and precipitated his hypoxic respiratory failure that led to a PEA arrest.  Given that the initial arrest was PEA  I suspect this was a respiratory arrest. -Overall, the level of LV dysfunction is quite out of proportion to the level of troponin value he has.  I would expect if this was a large LAD infarction that his troponin values would be considerably higher.  I would also expect ST elevation on his EKG.  He does not have ST elevation on my review of his EKG.  Given he suffered a respiratory arrest and now has wall motion abnormalities which can be observed with Takotsubo cardiomyopathy, this is the leading differential.  We would in normal circumstances without anemia, and questionable GI bleed, treat him with heparin and aspirin just to err on the side of caution.  However, the profound anemia will preclude this.  I would recommend gastroenterology evaluation for his anemia.  We can then revisit treating him medically for non-STEMI  after their evaluation. -Additionally, Tyler Petersen is 86 years old and quite frail.  He was in a SNF before this with a feeding tube.  Medical management may be the best option regardless. -For now we will continue to wean pressors.  We will trend troponins.  We will not give aspirin or heparin due to the bleeding issues described above.  Cardiology will follow along and make further recommendations pending improvement in his condition and evaluation of his anemia. -He will need aggressive diuresis at some point but now is not the time as he is on pressors.  Is this is weaned and critical care medicine is okay with this we should start to diurese him.  I will leave this at the discretion of critical care medicine.  He does not appear to be in cardiogenic shock based on blood pressures and lactic acid levels.  2. Paroxysmal atrial fibrillation -He has had brief episodes of A. fib.  No anticoagulation due to bleeding as above.  Should you run into sustained episodes would recommend amiodarone drip.  He still is on dopamine and as this is weaned he may have issues with blood pressure for other agents.  For questions or updates, please contact [place] Please consult [email or web address] for contact info under   Signed, [place] B[place] T. Audie Box, [place]  12/27/2019 5:05 PM

## 2019-12-31 NOTE — Progress Notes (Signed)
Pharmacy Antibiotic Note  Tyler Petersen is a 86 y.o. male admitted on 2020/01/06 with sepsis.  Pharmacy has been consulted for zosyn dosing.  Patient is being transferred to [facility] ICU from [place] following a cardiac arrest with asystole on 5/28. Subsequently developed right pneumothorax requiring chest tube placement. We will continue with antibiotics here until he can be worked up further and the family has a chance to discuss GOC. Patient has poor prognosis.   Plan: Zosyn 3.375g IV q8h (4 hour infusion).  Weight: 74.9 kg (165 lb 2 oz)  Temp (24hrs), Avg:97.2 F (36.2 C), Min:97.2 F (36.2 C), Max:97.2 F (36.2 C)  No results for input(s): WBC, CREATININE, LATICACIDVEN, VANCOTROUGH, VANCOPEAK, VANCORANDOM, GENTTROUGH, GENTPEAK, GENTRANDOM, TOBRATROUGH, TOBRAPEAK, TOBRARND, AMIKACINPEAK, AMIKACINTROU, AMIKACIN in the last 168 hours.  Estimated Creatinine Clearance: 61.8 mL/min (A) (by C-G formula based on SCr of 0.58 mg/dL (L)).    Allergies  Allergen Reactions  . Influenza Vaccines     Unknown    Antimicrobials this admission: Zosyn 5/29>>   Microbiology results: 5/29 BCx: sent  5/29 UCx: sent  5/29 Sputum: sent   5/29 MRSA PCR: sent  Thank you for allowing pharmacy to be a part of this patient's care.  Jeannetta Nap Jan 06, 2020 1:58 PM

## 2019-12-31 NOTE — Progress Notes (Signed)
  Echocardiogram 2D Echocardiogram has been performed.  Gerda Diss 12/10/2019, 3:43 PM

## 2019-12-31 NOTE — Progress Notes (Signed)
Brief Progress Note ABG 7.58/32.7/73/30.9 Current settings are 30%/ TV of 540 ( 8 cc), / Rate of 14  Will decrease TV to 7cc Recheck ABG at 2200 to evaluate effectiveness of change, and to determine any further needs  May need to decrease rate to 12 at that time if remains alkalotic  Bevelyn Ngo, MSN, AGACNP-BC [facility] See Amion for personal pager PCCM on call pager [phone] 05-Jan-2020 5:48 PM

## 2020-01-01 ENCOUNTER — Inpatient Hospital Stay (HOSPITAL_COMMUNITY): Payer: Medicare Other

## 2020-01-01 ENCOUNTER — Encounter (HOSPITAL_COMMUNITY): Admission: AD | Disposition: E | Payer: Self-pay | Source: Other Acute Inpatient Hospital | Attending: Pulmonary Disease

## 2020-01-01 DIAGNOSIS — D62 Acute posthemorrhagic anemia: Secondary | ICD-10-CM

## 2020-01-01 DIAGNOSIS — D649 Anemia, unspecified: Secondary | ICD-10-CM

## 2020-01-01 LAB — CBC
HCT: 33.1 % — ABNORMAL LOW (ref 39.0–52.0)
Hemoglobin: 10.2 g/dL — ABNORMAL LOW (ref 13.0–17.0)
MCH: 29.4 pg (ref 26.0–34.0)
MCHC: 30.8 g/dL (ref 30.0–36.0)
MCV: 95.4 fL (ref 80.0–100.0)
Platelets: 247 10*3/uL (ref 150–400)
RBC: 3.47 MIL/uL — ABNORMAL LOW (ref 4.22–5.81)
RDW: 17.2 % — ABNORMAL HIGH (ref 11.5–15.5)
WBC: 17.2 10*3/uL — ABNORMAL HIGH (ref 4.0–10.5)
nRBC: 0.1 % (ref 0.0–0.2)

## 2020-01-01 LAB — BASIC METABOLIC PANEL
Anion gap: 8 (ref 5–15)
BUN: 17 mg/dL (ref 8–23)
CO2: 28 mmol/L (ref 22–32)
Calcium: 7.5 mg/dL — ABNORMAL LOW (ref 8.9–10.3)
Chloride: 108 mmol/L (ref 98–111)
Creatinine, Ser: 0.62 mg/dL (ref 0.61–1.24)
GFR calc Af Amer: >60 mL/min (ref >60)
GFR calc non Af Amer: >60 mL/min (ref >60)
Glucose, Bld: 103 mg/dL — ABNORMAL HIGH (ref 70–99)
Potassium: 3.4 mmol/L — ABNORMAL LOW (ref 3.5–5.1)
Sodium: 144 mmol/L (ref 135–145)

## 2020-01-01 LAB — GLUCOSE, CAPILLARY
Glucose-Capillary: 107 mg/dL — ABNORMAL HIGH (ref 70–99)
Glucose-Capillary: 91 mg/dL (ref 70–99)
Glucose-Capillary: 92 mg/dL (ref 70–99)
Glucose-Capillary: 96 mg/dL (ref 70–99)

## 2020-01-01 LAB — URINE CULTURE: Culture: NO GROWTH

## 2020-01-01 LAB — MAGNESIUM: Magnesium: 2.3 mg/dL (ref 1.7–2.4)

## 2020-01-01 LAB — PHOSPHORUS: Phosphorus: 2.6 mg/dL (ref 2.5–4.6)

## 2020-01-01 SURGERY — ESOPHAGOGASTRODUODENOSCOPY (EGD) WITH PROPOFOL
Anesthesia: Moderate Sedation

## 2020-01-01 MED ORDER — MORPHINE 100MG IN NS 100ML (1MG/ML) PREMIX INFUSION
0.0000 mg/h | INTRAVENOUS | Status: DC
  Administered 2020-01-01: 10 mg/h via INTRAVENOUS
  Administered 2020-01-01: 20 mg/h via INTRAVENOUS
  Administered 2020-01-01: 5 mg/h via INTRAVENOUS
  Filled 2020-01-01 (×2): qty 100

## 2020-01-01 MED ORDER — POLYVINYL ALCOHOL 1.4 % OP SOLN
1.0000 [drp] | Freq: Four times a day (QID) | OPHTHALMIC | Status: DC | PRN
  Filled 2020-01-01: qty 15

## 2020-01-01 MED ORDER — ACETAMINOPHEN 325 MG PO TABS: 650.0000 mg | ORAL_TABLET | Freq: Four times a day (QID) | ORAL | Status: DC | PRN

## 2020-01-01 MED ORDER — DIPHENHYDRAMINE HCL 50 MG/ML IJ SOLN: 25.0000 mg | INTRAMUSCULAR | Status: DC | PRN

## 2020-01-01 MED ORDER — DEXTROSE 5 % IV SOLN: INTRAVENOUS | Status: DC

## 2020-01-01 MED ORDER — ACETAMINOPHEN 650 MG RE SUPP: 650.0000 mg | Freq: Four times a day (QID) | RECTAL | Status: DC | PRN

## 2020-01-01 MED ORDER — POLYETHYLENE GLYCOL 3350 17 G PO PACK: 17.0000 g | PACK | Freq: Every day | ORAL | Status: DC

## 2020-01-01 MED ORDER — SODIUM CHLORIDE 0.9 % IV SOLN
0.0000 mg/h | INTRAVENOUS | Status: DC
  Administered 2020-01-01: 30 mg/h via INTRAVENOUS
  Administered 2020-01-01: 15 mg/h via INTRAVENOUS
  Filled 2020-01-01: qty 10

## 2020-01-01 MED ORDER — GLYCOPYRROLATE 0.2 MG/ML IJ SOLN: 0.2000 mg | INTRAMUSCULAR | Status: DC | PRN

## 2020-01-01 MED ORDER — MORPHINE BOLUS VIA INFUSION
5.0000 mg | INTRAVENOUS | Status: DC | PRN
  Administered 2020-01-01 (×10): 5 mg via INTRAVENOUS
  Filled 2020-01-01: qty 5

## 2020-01-01 MED ORDER — GLYCOPYRROLATE 0.2 MG/ML IJ SOLN
0.2000 mg | INTRAMUSCULAR | Status: DC | PRN
  Administered 2020-01-01: 0.2 mg via INTRAVENOUS
  Filled 2020-01-01: qty 1

## 2020-01-01 MED ORDER — MORPHINE SULFATE (PF) 2 MG/ML IV SOLN: 2.0000 mg | INTRAVENOUS | Status: DC | PRN

## 2020-01-01 MED ORDER — GLYCOPYRROLATE 1 MG PO TABS: 1.0000 mg | ORAL_TABLET | ORAL | Status: DC | PRN

## 2020-01-01 MED ORDER — DOCUSATE SODIUM 50 MG/5ML PO LIQD
100.0000 mg | Freq: Two times a day (BID) | ORAL | Status: DC
  Administered 2020-01-01: 100 mg

## 2020-01-02 LAB — LEGIONELLA PNEUMOPHILA SEROGP 1 UR AG: L. pneumophila Serogp 1 Ur Ag: NEGATIVE

## 2020-01-02 NOTE — Discharge Summary (Signed)
Physician Death Summary  Patient ID: Tyler Petersen MRN: [id number] DOB/AGE: 1928-10-05 86 y.o.  Admit date: 12/16/2019 Discharge date: 01-28-2020  Admission Diagnoses: Cardiac arrest  Discharge Diagnoses:  Active Problems:   Cardiac arrest (HCC) Acute hypoxic respiratory failure Pneumonia Right pneumothorax Severe cardiomyopathy Non-ST elevation MI Paroxysmal atrial fibrillation Severe anemia Bradycardia Dementia, failure to thrive  Discharged Condition: Deceased  Hospital Course:  86 year old nursing home resident with chronic medical illness as below, multiple recent admissions to [facility] for infections, pneumonia,   Admit to [facility] on 5/24 with generalized weakness, severe dehydration, significant anemia with hemoglobin 7.5 on admission.  Subsequently Hb dropped to 4.9 on 5/26 with no frank bleed, Hemoccult positive stool.  Transfused 4 units PRBC and received IV iron.  Course complicated by profound leukocytosis group B strep bacteremia, for which he is on empiric Zosyn, hypokalemia, right elbow swelling with venous duplex negative for DVT  He had a cardiac arrest on 5/28 with asystole, left femoral line placed intubated and transferred to ICU.  Subsequently developed right pneumothorax requiring chest tube placement.  Transferred to [facility] for further management. During transport he was noted to be severely bradycardic and given 1 amp of atropine by carelink  At [facility] hospital he continued to be in respiratory failure, requiring vent support and shock and bradycardia for which she was given Levophed and then dopamine.  Echo showed new severe cardiomyopathy with wall motion abnormalities concerning for stress cardiomyopathy versus non-ST elevation MI Cardiology and gastroenterology were consulted  After discussion with son and daughter at bedside a request was made by family to transition to comfort care given poor quality of life and declining status.  He was  compassionately extubated, placed on morphine drip and passed away later that day.  Chilton Greathouse MD [facility] Please see [email or web address] for pager details.  01/02/2020, 12:18 PM

## 2020-01-03 LAB — CULTURE, RESPIRATORY W GRAM STAIN

## 2020-01-03 NOTE — Progress Notes (Signed)
Pt son and daughter at bedside. Emotional support given to family, room lighting adjusted per family request.

## 2020-01-03 NOTE — Procedures (Signed)
Extubation Procedure Note  Patient Details:   Name: Othar Curto DOB: 09-18-1928 MRN: [id number]   Airway Documentation:    Vent end date: 01/05/2020 Vent end time: 1430   Evaluation  Patient extubated per comfort care measures. RN and family at bedside.   Harmon Dun Babygirl Trager 01-05-20, 2:31 PM

## 2020-01-03 NOTE — Progress Notes (Signed)
Cardiology Progress Note  Patient ID: Tyler Petersen MRN: [id number] DOB: 1929-04-21 Date of Encounter: 01-07-20  Primary Cardiologist: No primary care provider on file.  Subjective   Chief Complaint: intubated/vent  HPI: Remains intubated. Issues with bradycardia and was on dopamine. Will follow commands.   ROS:  All other ROS reviewed and negative. Pertinent positives noted in the HPI.     Inpatient Medications  Scheduled Meds: . chlorhexidine gluconate (MEDLINE KIT)  15 mL Mouth Rinse BID  . Chlorhexidine Gluconate Cloth  6 each Topical Daily  . docusate  100 mg Per Tube BID  . heparin  5,000 Units Subcutaneous Q8H  . mouth rinse  15 mL Mouth Rinse 10 times per day  . pantoprazole (PROTONIX) IV  40 mg Intravenous QHS  . [START ON 01/02/2020] polyethylene glycol  17 g Per Tube Daily   Continuous Infusions: . DOPamine 2 mcg/kg/min (2020/01/07 0800)  . fentaNYL infusion INTRAVENOUS 200 mcg/hr (2020-01-07 0800)  . piperacillin-tazobactam (ZOSYN)  IV 3.375 g (01-07-20 6195)  . propofol (DIPRIVAN) infusion 20 mcg/kg/min (January 07, 2020 0800)   PRN Meds: docusate sodium, fentaNYL (SUBLIMAZE) injection, fentaNYL (SUBLIMAZE) injection, midazolam, midazolam, polyethylene glycol   Vital Signs   Vitals:   Jan 07, 2020 0815 01-07-2020 0841 2020-01-07 1015 01-07-2020 1030  BP:   133/74 (!) 69/46  Pulse: 72  (!) 114 85  Resp: 13  (!) 29 14  Temp: 98.8 F (37.1 C) 98.8 F (37.1 C) 97.9 F (36.6 C) 97.7 F (36.5 C)  TempSrc:  Core    SpO2: 90%  98% 98%  Weight:        Intake/Output Summary (Last 24 hours) at January 07, 2020 1057 Last data filed at 01/07/20 0800 Gross per 24 hour  Intake 618.84 ml  Output 1795 ml  Net -1176.16 ml   Last 3 Weights 12/27/2019 12/17/2019 03/11/2019  Weight (lbs) 165 lb 2 oz 155 lb 176 lb  Weight (kg) 74.9 kg 70.308 kg 79.833 kg      Telemetry  Overnight telemetry shows ST 100s, intermittent SB with rates in 40-50 bpm range, appears to have atrial bigeminy at  times, no evidence of significant conduction disease, which I personally reviewed.   ECG  The most recent ECG shows SB 24, old anteroseptal infarct, TWI anterolateral leads which I personally reviewed.   Physical Exam   Vitals:   01/07/20 0815 07-Jan-2020 0841 01-07-2020 1015 2020-01-07 1030  BP:   133/74 (!) 69/46  Pulse: 72  (!) 114 85  Resp: 13  (!) 29 14  Temp: 98.8 F (37.1 C) 98.8 F (37.1 C) 97.9 F (36.6 C) 97.7 F (36.5 C)  TempSrc:  Core    SpO2: 90%  98% 98%  Weight:         Intake/Output Summary (Last 24 hours) at Jan 07, 2020 1057 Last data filed at Jan 07, 2020 0800 Gross per 24 hour  Intake 618.84 ml  Output 1795 ml  Net -1176.16 ml    Last 3 Weights 12/10/2019 12/17/2019 03/11/2019  Weight (lbs) 165 lb 2 oz 155 lb 176 lb  Weight (kg) 74.9 kg 70.308 kg 79.833 kg    Body mass index is 25.11 kg/m.   General: intubated on vent  Head: Atraumatic, normal size  Eyes: PEERLA, EOMI  Neck: Supple, no JVD Endocrine: No thryomegaly Cardiac: Normal S1, S2; RRR; no murmurs, rubs, or gallops Lungs: rales/rhonchi bilaterally   Abd: Soft, nontender, no hepatomegaly  Ext: No edema, pulses 2+ Musculoskeletal: No deformities, BUE and BLE strength normal and equal Skin:  Warm and dry, no rashes   Neuro: awake, will follow commands, will move extremities with commands   Labs  High Sensitivity Troponin:   Recent Labs  Lab 12/17/2019 1405 12/05/2019 1505 12/26/2019 1720 12/30/2019 1851  TROPONINIHS 792* 696* 664* 605*     Cardiac EnzymesNo results for input(s): TROPONINI in the last 168 hours. No results for input(s): TROPIPOC in the last 168 hours.  Chemistry Recent Labs  Lab 12/30/2019 1405 12/07/2019 1405 12/18/2019 1427 12/16/2019 2318 01-24-2020 0500  NA 146*   < > 145 146* 144  K 4.2   < > 3.6 3.5 3.4*  CL 107  --   --   --  108  CO2 29  --   --   --  28  GLUCOSE 148*  --   --   --  103*  BUN 22  --   --   --  17  CREATININE 0.74  --   --   --  0.62  CALCIUM 7.4*  --   --   --   7.5*  PROT 4.1*  --   --   --   --   ALBUMIN 1.7*  --   --   --   --   AST 24  --   --   --   --   ALT 34  --   --   --   --   ALKPHOS 51  --   --   --   --   BILITOT 0.7  --   --   --   --   GFRNONAA >60  --   --   --  >60  GFRAA >60  --   --   --  >60  ANIONGAP 10  --   --   --  8   < > = values in this interval not displayed.    Hematology Recent Labs  Lab 12/18/2019 1405 12/08/2019 1405 12/24/2019 1427 12/11/2019 2318 24-Jan-2020 0500  WBC 22.2*  --   --   --  17.2*  RBC 3.31*  --   --   --  3.47*  HGB 9.7*   < > 9.5* 10.2* 10.2*  HCT 31.8*   < > 28.0* 30.0* 33.1*  MCV 96.1  --   --   --  95.4  MCH 29.3  --   --   --  29.4  MCHC 30.5  --   --   --  30.8  RDW 17.5*  --   --   --  17.2*  PLT 263  --   --   --  247   < > = values in this interval not displayed.   BNP Recent Labs  Lab 12/21/2019 1405  BNP 775.7*    DDimer No results for input(s): DDIMER in the last 168 hours.   Radiology  DG CHEST PORT 1 VIEW  Result Date: 01/24/20 CLINICAL DATA:  Hypoxia EXAM: PORTABLE CHEST 1 VIEW COMPARISON:  Dec 31, 2019 FINDINGS: Endotracheal tube tip is 4.1 cm above the carina. There is a chest tube on the right. No pneumothorax. There is widespread subcutaneous emphysema, increased from 1 day prior. There is a small left pleural effusion. There is bibasilar atelectasis. Heart size and pulmonary vascularity are normal. No adenopathy. There is aortic atherosclerosis. No bone lesions. IMPRESSION: Tube positions as described. No pneumothorax. There is extensive subcutaneous emphysema, increased from 1 day prior. Small left pleural effusion with bibasilar atelectasis. Stable cardiac  silhouette. Aortic Atherosclerosis (ICD10-I70.0). Electronically Signed   By: Lowella Grip III M.D.   On: 01-18-2020 10:16   DG Chest Port 1 View  Result Date: 12/27/2019 CLINICAL DATA:  Hypoxia EXAM: PORTABLE CHEST 1 VIEW COMPARISON:  Dec 31, 2019 study obtained earlier in the day FINDINGS: Endotracheal tube tip  is 3.0 cm above the carina. Chest tube on the right unchanged in position. No pneumothorax. Subcutaneous air noted on the right inferolaterally. There are bilateral pleural effusions. There is right base atelectasis. Heart size and pulmonary vascularity within normal limits. No adenopathy. No bone lesions. IMPRESSION: Tube positions as described without evident pneumothorax. Pleural effusions bilaterally with right base atelectasis. Stable cardiac silhouette. Electronically Signed   By: Lowella Grip III M.D.   On: 12/03/2019 15:04   ECHOCARDIOGRAM COMPLETE  Result Date: 12/29/2019    ECHOCARDIOGRAM REPORT   Patient Name:   DEMETRIA LIGHTSEY Schnoebelen Date of Exam: 12/27/2019 Medical Rec #:  [id number]           Height:       68.0 in Accession #:    [id number]          Weight:       165.1 lb Date of Birth:  1928-11-13          BSA:          1.884 m Patient Age:    86 years            BP:           110/55 mmHg Patient Gender: M                   HR:           53 bpm. Exam Location:  Inpatient Procedure: 2D Echo                       STAT ECHO Reported to: Dr Lyman Bishop on 12/12/2019 3:42:00 PM. Indications:    Cardiac arrest  History:        Patient has no prior history of Echocardiogram examinations.                 Previous Myocardial Infarction, Arrythmias:Atrial Fibrillation;                 Risk Factors:Hypertension.  Sonographer:    Clayton Lefort RDCS (AE) Referring Phys: [id number] [facility]  Sonographer Comments: Drainage tube and bandages in subcostal area. IMPRESSIONS  1. EF severely reduced, 25%. All mid and apical segments, and apex are akinetic. There is apical ballooning with basal hyperkinesis. Findings could represent a stress-induced cardiomyopathy vs large wrap around LAD infarction. Clinical correlation is recommended.  2. Left ventricular ejection fraction, by estimation, is 25 to 30%. The left ventricle has severely decreased function. The left ventricle demonstrates regional wall motion  abnormalities (see scoring diagram/findings for description). There is mild concentric left ventricular hypertrophy. Left ventricular diastolic parameters are consistent with Grade I diastolic dysfunction (impaired relaxation).  3. Right ventricular systolic function is normal. The right ventricular size is normal. Tricuspid regurgitation signal is inadequate for assessing PA pressure.  4. The mitral valve is degenerative. Trivial mitral valve regurgitation. No evidence of mitral stenosis.  5. The aortic valve is tricuspid. Aortic valve regurgitation is not visualized. Mild aortic valve sclerosis is present, with no evidence of aortic valve stenosis. FINDINGS  Left Ventricle: Left ventricular ejection fraction, by estimation, is 25 to 30%. The left ventricle has  severely decreased function. The left ventricle demonstrates regional wall motion abnormalities. The left ventricular internal cavity size was normal  in size. There is mild concentric left ventricular hypertrophy. Left ventricular diastolic parameters are consistent with Grade I diastolic dysfunction (impaired relaxation). Normal left ventricular filling pressure.  LV Wall Scoring: The mid and distal lateral wall, mid and distal anterior septum, and entire apex are akinetic. Right Ventricle: The right ventricular size is normal. No increase in right ventricular wall thickness. Right ventricular systolic function is normal. Tricuspid regurgitation signal is inadequate for assessing PA pressure. Left Atrium: Left atrial size was normal in size. Right Atrium: Right atrial size was normal in size. Pericardium: Trivial pericardial effusion is present. Presence of pericardial fat pad. Mitral Valve: The mitral valve is degenerative in appearance. Trivial mitral valve regurgitation. No evidence of mitral valve stenosis. Tricuspid Valve: The tricuspid valve is grossly normal. Tricuspid valve regurgitation is not demonstrated. No evidence of tricuspid stenosis.  Aortic Valve: The aortic valve is tricuspid. Aortic valve regurgitation is not visualized. Mild aortic valve sclerosis is present, with no evidence of aortic valve stenosis. Aortic valve mean gradient measures 3.0 mmHg. Aortic valve peak gradient measures 6.8 mmHg. Aortic valve area, by VTI measures 1.88 cm. Pulmonic Valve: The pulmonic valve was grossly normal. Pulmonic valve regurgitation is not visualized. No evidence of pulmonic stenosis. Aorta: The aortic root and ascending aorta are structurally normal, with no evidence of dilitation. Venous: IVC assessment for right atrial pressure unable to be performed due to mechanical ventilation. IAS/Shunts: The atrial septum is grossly normal.  LEFT VENTRICLE PLAX 2D LVIDd:         4.20 cm     Diastology LVIDs:         3.50 cm     LV e' medial:   6.09 cm/s LV PW:         1.30 cm     LV E/e' medial: 16.6 LV IVS:        1.30 cm LVOT diam:     1.90 cm LV SV:         38 LV SV Index:   20 LVOT Area:     2.84 cm  LV Volumes (MOD) LV vol d, MOD A2C: 64.3 ml LV vol d, MOD A4C: 81.8 ml LV vol s, MOD A2C: 34.2 ml LV vol s, MOD A4C: 56.6 ml LV SV MOD A2C:     30.1 ml LV SV MOD A4C:     81.8 ml LV SV MOD BP:      28.2 ml RIGHT VENTRICLE RV Basal diam:  2.60 cm RV S prime:     12.33 cm/s TAPSE (M-mode): 1.2 cm LEFT ATRIUM             Index       RIGHT ATRIUM          Index LA diam:        3.00 cm 1.59 cm/m  RA Area:     8.49 cm LA Vol (A2C):   54.1 ml 28.72 ml/m RA Volume:   15.10 ml 8.02 ml/m LA Vol (A4C):   48.3 ml 25.64 ml/m LA Biplane Vol: 52.8 ml 28.03 ml/m  AORTIC VALVE AV Area (Vmax):    1.47 cm AV Area (Vmean):   1.54 cm AV Area (VTI):     1.88 cm AV Vmax:           130.00 cm/s AV Vmean:  80.900 cm/s AV VTI:            0.204 m AV Peak Grad:      6.8 mmHg AV Mean Grad:      3.0 mmHg LVOT Vmax:         67.20 cm/s LVOT Vmean:        43.900 cm/s LVOT VTI:          0.135 m LVOT/AV VTI ratio: 0.66  AORTA Ao Root diam: 3.80 cm Ao Asc diam:  3.10 cm MV E velocity:  100.99 cm/s MV A velocity: 104.79 cm/s  SHUNTS MV E/A ratio:  0.96         Systemic VTI:  0.14 m                             Systemic Diam: 1.90 cm Eleonore Chiquito MD Electronically signed by Eleonore Chiquito MD Signature Date/Time: 12/20/2019/4:09:33 PM    Final     Cardiac Studies  TTE 12/14/2019 1. EF severely reduced, 25%. All mid and apical segments, and apex are  akinetic. There is apical ballooning with basal hyperkinesis. Findings  could represent a stress-induced cardiomyopathy vs large wrap around LAD  infarction. Clinical correlation is  recommended.  2. Left ventricular ejection fraction, by estimation, is 25 to 30%. The  left ventricle has severely decreased function. The left ventricle  demonstrates regional wall motion abnormalities (see scoring  diagram/findings for description). There is mild  concentric left ventricular hypertrophy. Left ventricular diastolic  parameters are consistent with Grade I diastolic dysfunction (impaired  relaxation).  3. Right ventricular systolic function is normal. The right ventricular  size is normal. Tricuspid regurgitation signal is inadequate for assessing  PA pressure.  4. The mitral valve is degenerative. Trivial mitral valve regurgitation.  No evidence of mitral stenosis.  5. The aortic valve is tricuspid. Aortic valve regurgitation is not  visualized. Mild aortic valve sclerosis is present, with no evidence of  aortic valve stenosis.   Patient Profile  Johnross Nabozny is a 86 y.o. male with PNA, advanced age, htn, Afib who was admitted to [facility] 12/16/2019 as transfer from [place] after cardiac arrest. Found to have EF 25-30% with WMA. He is being treated in the ICU for PNA and possible bacteremia (1/4 positive cultures).   Assessment & Plan   1. Cardiac Arrest/PEA/VT? -admitted 12/26/2019 to [facility] for dehydration and anemia. Hgb was down to 4.9 and received transfusions. Hgb 12/20/2019 was 11. Clearly bleeding from somewhere.    -was going for CT scan at [facility] on 5/28 and suffered what is reported as PEA then VT. Hypoxic respiratory failure was etiology. Being treated for PNA and possible bacteremia. Suspect volume overload may have led to this vs PNA/sepsis. -he was admitted to [facility] in April for PNA and had extensive ICU stay that required PEG placement. He was living in a SNF prior to this admission.  -awake, following commands. No need for hypothermia.   2. Acute Systolic HF, EF 37-90% -Echo with profound WMA that either represent Takotsubo or LAD infarct -trop elevation mild -EKG with old anteroseptal infarct and anterolateral TWI. TWI are new but old anteroseptal infarct seen on prior -overall, suspect Takotsubo rather than LAD infarct as trop elevation is mild and there is no ST elevation on EKG.  -not candidate for heparin/asa/plavix given anemia of unknown etiology -once off pressure, will need to diurese him. Was on dopamine for bradycardia see below.  Will defer diuresis timing to CCM.  -I did discuss this with his son yesterday in detail of his new cardiac diagnosis and anemia issue. We are all in agreement to continue with supportive care.   3. Sinus Bradycardia/Afib  -review tele. Has had sinus bradycardia in the 40s with intermittent PAcs that are irregular. Has also had Afib. No current indications for pacing but we will need to monitor closely for tachy brady syndrome.  -no current indication for ppm -stop dopamine -we will follow along as he may develop ppm needs but none currently -suspect brady will get better with treatment of PNA/sepsis   For questions or updates, please contact [place] Please consult [email or web address] for contact info under   Time Spent with Patient: I have spent a total of 25 minutes with patient reviewing hospital notes, telemetry, EKGs, labs and examining the patient as well as establishing an assessment and plan that was discussed with the patient.  > 50% of time  was spent in direct patient care.    Signed, Addison Naegeli. Audie Box, [place]  2020/01/26 10:57 AM

## 2020-01-03 NOTE — Progress Notes (Addendum)
PT DNR at time of death. TOD 2236. Pt with no breath sounds to auscultation, no palpable pulse and absent chest rise confirmed with nurse Casimiro Needle. Pt son and daughter at bedside side at time of death. Emotional support given to family. Pt personal belongings(clothing) given to family, daughter comments that she has pt wallet. 100cc Morphine 500mg /100cc concentration wasted in sink with nurse .

## 2020-01-03 NOTE — Consult Note (Addendum)
Consultation  Referring Provider: Dr. Vaughan Browner      Primary Care Physician:  Maryella Shivers, MD Primary Gastroenterologist: Althia Forts        Reason for Consultation: Anemia            HPI:   Tyler Petersen is a 86 y.o. male with a past medical history as listed below including hypertension, A. 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  We consulted in regards to anemia.     Today, patient is found intubated and sedated and history is garnered from previous physicians charts.    Patient was admitted to [facility] on 5/20 for generalized weakness, severe dehydration, significant anemia and hemoglobin of 7.5 on admission.  Subsequently hemoglobin dropped to 4.9 on 5/26 with no frank bleed, Hemoccult was positive.  He was transfused 4 units PRBCs and received IV iron.  He was found to have profound leukocytosis group B strep bacteremia for which she is on empiric Zosyn, hypokalemia, right elbow swelling with venous duplex negative for DVT.  Patient then suffered a cardiac arrest on 5/28 with asystole, left femoral line placed, intubated and transferred to the ICU.  Subsequently developed right pneumothorax requiring chest tube placement and transferred to [facility] for further management.  During transport he was noted to be severely bradycardic and given 1 amp of atropine by [facility].  GI history: 03/08/2019 consult by our service for anemia and dark stool 03/08/2019 EGD with Dr. Hilarie Fredrickson: - Normal esophagus. - Erythematous mucosa in the stomach. - Normal examined duodenum. - No specimens collected. 03/09/2019 colonoscopy with Dr. Hilarie Fredrickson: - The examined portion of the ileum was normal. - Moderate diverticulosis in the sigmoid colon, in the descending  colon, in the transverse colon, at the hepatic flexure and in the ascending colon. - Internal hemorrhoids. - The examination was otherwise normal. - No source for GI bleeding found on this examination. - No specimens collected. Recommendations for video capsule endoscopy if repeat anemia  Past Medical History:  Diagnosis Date  . Acute blood loss anemia   . Anxiety   . BPH (benign prostatic hyperplasia)   . GI bleed   . HTN (hypertension)   . Hyponatremia   . NSTEMI (non-ST elevated myocardial infarction) ([place])   . Osteoarthritis   . Osteoarthritis   . Rhabdomyolysis   . UTI (urinary tract infection)     Past Surgical History:  Procedure Laterality Date  . COLONOSCOPY WITH PROPOFOL N/A 03/09/2019   Procedure: COLONOSCOPY WITH PROPOFOL;  Surgeon: Jerene Bears, MD;  Location: [place];  Service: Gastroenterology;  Laterality: N/A;  . ESOPHAGOGASTRODUODENOSCOPY (EGD) WITH PROPOFOL N/A 03/08/2019   Procedure: ESOPHAGOGASTRODUODENOSCOPY (EGD) WITH PROPOFOL;  Surgeon: Jerene Bears, MD;  Location: [facility] ENDOSCOPY;  Service: Gastroenterology;  Laterality: N/A;  . ORIF right pelvic/hip  right    No family history on file.  Social History   Tobacco Use  . Smoking status: Never Smoker  . Smokeless tobacco: Never Used  Substance Use Topics  . Alcohol use: Not Currently  . Drug use: Not Currently    Prior to Admission medications   Medication Sig Start Date End Date Taking? Authorizing Provider  acetaminophen (TYLENOL) 325 MG tablet Take 650 mg by mouth every 6 (six) hours as needed.    [provider]  amLODipine (NORVASC) 10 MG tablet Take 10 mg by mouth every Monday, Wednesday, and Friday.  01/16/19   [provider]  aspirin EC 81 MG tablet Take 81 mg by mouth daily.    [provider]  bisacodyl (DULCOLAX) 10 MG suppository Place 10 mg rectally as needed for mild constipation, moderate constipation or severe constipation.    [provider]    doxycycline (VIBRAMYCIN) 100 MG capsule Take 1 capsule (100 mg total) by mouth 2 (two) times daily. 12/17/19   Deno Etienne, DO  finasteride (PROSCAR) 5 MG tablet Take 5 mg by mouth daily.    [provider]  FLUoxetine (PROZAC) 10 MG capsule Take 10 mg by mouth daily.    [provider]  losartan (COZAAR) 50 MG tablet Take 50 mg by mouth daily.    [provider]  magnesium hydroxide (MILK OF MAGNESIA) 400 MG/5ML suspension Take 30 mLs by mouth daily as needed for mild constipation or moderate constipation.    [provider]  OXYGEN Inhale 2 L into the lungs continuous.    [provider]  pantoprazole (PROTONIX) 40 MG tablet Take 40 mg by mouth daily.     [provider]  tamsulosin (FLOMAX) 0.4 MG CAPS capsule Take 0.4 mg by mouth.    [provider]  traZODone (DESYREL) 50 MG tablet Take 50 mg by mouth at bedtime. 02/10/19   [provider]    Current Facility-Administered Medications  Medication Dose Route Frequency Provider Last Rate Last Admin  . chlorhexidine gluconate (MEDLINE KIT) (PERIDEX) 0.12 % solution 15 mL  15 mL Mouth Rinse BID Simonne Maffucci B, MD   15 mL at January 18, 2020 0751  . Chlorhexidine Gluconate Cloth 2 % PADS 6 each  6 each Topical Daily Mannam, Praveen, MD   6 each at 12/30/2019 1300  . docusate (COLACE) 50 MG/5ML liquid 100 mg  100 mg Per Tube BID Mannam, Praveen, MD   100 mg at 18-Jan-2020 0946  . docusate sodium (COLACE) capsule 100 mg  100 mg Oral BID PRN Mannam, Praveen, MD      . DOPamine (INTROPIN) 800 mg in dextrose 5 % 250 mL (3.2 mg/mL) infusion  0-20 mcg/kg/min Intravenous Titrated Mannam, Praveen, MD 2.81 mL/hr at Jan 18, 2020 0800 2 mcg/kg/min at 01-18-2020 0800  . fentaNYL (SUBLIMAZE) injection 25 mcg  25 mcg Intravenous Q15 min PRN Mannam, Praveen, MD   25 mcg at 2020-01-18 0402  . fentaNYL (SUBLIMAZE) injection 25-100 mcg  25-100 mcg Intravenous Q30 min PRN Mannam, Praveen, MD   100 mcg at 01-18-20  0750  . fentaNYL 2582mg in NS 2560m(1063mml) infusion-PREMIX  25-200 mcg/hr Intravenous Continuous Mannam, Praveen, MD 20 mL/hr at 05/06-16-202100 200 mcg/hr at 05/16-Jun-202100  . heparin injection 5,000 Units  5,000 Units Subcutaneous Q8H Mannam, Praveen, MD   5,000 Units at 05/16-Jun-202127  . MEDLINE mouth rinse  15 mL Mouth Rinse 10 times per day McQSimonne Maffucci MD   15 mL at 12/08/14/202144  . midazolam (VERSED) injection 1 mg  1 mg Intravenous Q15 min PRN Mannam, Praveen, MD      . midazolam (VERSED) injection 1 mg  1 mg Intravenous Q2H PRN Mannam, Praveen, MD   1 mg at 12/2019/01/1657  . pantoprazole (PROTONIX) injection 40 mg  40 mg Intravenous QHS Mannam, Praveen, MD   40 mg at 12/07/2019 2258  . piperacillin-tazobactam (ZOSYN) IVPB 3.375 g  3.375 g Intravenous Q8H Mannam, Praveen,  MD 12.5 mL/hr at 01/29/20 0648 3.375 g at 01/29/20 0648  . polyethylene glycol (MIRALAX / GLYCOLAX) packet 17 g  17 g Oral Daily PRN Mannam, Praveen, MD      . Derrill Memo ON 01/02/2020] polyethylene glycol (MIRALAX / GLYCOLAX) packet 17 g  17 g Per Tube Daily Mannam, Praveen, MD      . propofol (DIPRIVAN) 1000 MG/100ML infusion  5-40 mcg/kg/min Intravenous Titrated Mannam, Praveen, MD 8.99 mL/hr at 01-29-2020 0800 20 mcg/kg/min at 01-29-2020 0800    Allergies as of 12/19/2019 - Review Complete 12/12/2019  Allergen Reaction Noted  . Influenza vaccines  03/07/2019     Review of Systems:    Unable to complete due to intubation and sedation.   Physical Exam:  Vital signs in last 24 hours: Temp:  [96.8 F (36 C)-98.8 F (37.1 C)] 97.7 F (36.5 C) (05/30 1030) Pulse Rate:  [42-114] 85 (05/30 1030) Resp:  [13-36] 14 (05/30 1030) BP: (69-147)/(36-79) 69/46 (05/30 1030) SpO2:  [90 %-99 %] 99 % (05/30 1102) FiO2 (%):  [30 %-40 %] 40 % (05/30 1102) Weight:  [74.9 kg] 74.9 kg (05/29 1254) Last BM Date: 12/20/2019 General:   Chronically ill-appearing, Caucasian male, intubated and sedated Head:  Normocephalic and  atraumatic. Eyes:   PEERL, EOMI. No icterus. Conjunctiva pink. Ears:  Normal auditory acuity. Neck:  Supple Throat: Oral cavity and pharynx without inflammation, swelling or lesion.  Lungs: Respirations even and unlabored. Lungs clear to auscultation bilaterally.   No wheezes, crackles, or rhonchi.  Intubated Heart: Normal S1, S2. No MRG. Regular rate and rhythm. No peripheral edema, cyanosis or pallor.  Abdomen:  Soft, nondistended, nontender. No rebound or guarding. Normal bowel sounds. No appreciable masses or hepatomegaly.  PEG tube Rectal:  Not performed.  Msk:  Symmetrical without gross deformities. Peripheral pulses intact.  Extremities:  Without edema, no deformity or joint abnormality. Neurologic: Sedated Skin:   Dry and intact without significant lesions or rashes. Psychiatric: Sedated, opens eyes to name   LAB RESULTS: Recent Labs    12/21/2019 1405 12/29/2019 1405 12/11/2019 1427 12/17/2019 2318 01/29/2020 0500  WBC 22.2*  --   --   --  17.2*  HGB 9.7*   < > 9.5* 10.2* 10.2*  HCT 31.8*   < > 28.0* 30.0* 33.1*  PLT 263  --   --   --  247   < > = values in this interval not displayed.   BMET Recent Labs    12/18/2019 1405 12/05/2019 1405 12/13/2019 1427 12/13/2019 2318 29-Jan-2020 0500  NA 146*   < > 145 146* 144  K 4.2   < > 3.6 3.5 3.4*  CL 107  --   --   --  108  CO2 29  --   --   --  28  GLUCOSE 148*  --   --   --  103*  BUN 22  --   --   --  17  CREATININE 0.74  --   --   --  0.62  CALCIUM 7.4*  --   --   --  7.5*   < > = values in this interval not displayed.   LFT Recent Labs    12/17/2019 1405  PROT 4.1*  ALBUMIN 1.7*  AST 24  ALT 34  ALKPHOS 51  BILITOT 0.7   PT/INR Recent Labs    12/13/2019 1448  LABPROT 14.4  INR 1.2    STUDIES: DG CHEST PORT 1 VIEW  Result  Date: 01-18-2020 CLINICAL DATA:  Hypoxia EXAM: PORTABLE CHEST 1 VIEW COMPARISON:  Dec 31, 2019 FINDINGS: Endotracheal tube tip is 4.1 cm above the carina. There is a chest tube on the right. No  pneumothorax. There is widespread subcutaneous emphysema, increased from 1 day prior. There is a small left pleural effusion. There is bibasilar atelectasis. Heart size and pulmonary vascularity are normal. No adenopathy. There is aortic atherosclerosis. No bone lesions. IMPRESSION: Tube positions as described. No pneumothorax. There is extensive subcutaneous emphysema, increased from 1 day prior. Small left pleural effusion with bibasilar atelectasis. Stable cardiac silhouette. Aortic Atherosclerosis (ICD10-I70.0). Electronically Signed   By: Lowella Grip III M.D.   On: 01/18/20 10:16   DG Chest Port 1 View  Result Date: 12/18/2019 CLINICAL DATA:  Hypoxia EXAM: PORTABLE CHEST 1 VIEW COMPARISON:  Dec 31, 2019 study obtained earlier in the day FINDINGS: Endotracheal tube tip is 3.0 cm above the carina. Chest tube on the right unchanged in position. No pneumothorax. Subcutaneous air noted on the right inferolaterally. There are bilateral pleural effusions. There is right base atelectasis. Heart size and pulmonary vascularity within normal limits. No adenopathy. No bone lesions. IMPRESSION: Tube positions as described without evident pneumothorax. Pleural effusions bilaterally with right base atelectasis. Stable cardiac silhouette. Electronically Signed   By: Lowella Grip III M.D.   On: 12/14/2019 15:04   ECHOCARDIOGRAM COMPLETE  Result Date: 12/25/2019    ECHOCARDIOGRAM REPORT   Patient Name:   DOY TAAFFE Contee Date of Exam: 01/02/2020 Medical Rec #:  [id number]           Height:       68.0 in Accession #:    [id number]          Weight:       165.1 lb Date of Birth:  23-Apr-1929          BSA:          1.884 m Patient Age:    34 years            BP:           110/55 mmHg Patient Gender: M                   HR:           53 bpm. Exam Location:  Inpatient Procedure: 2D Echo                       STAT ECHO Reported to: Dr Lyman Bishop on 12/18/2019 3:42:00 PM. Indications:    Cardiac arrest  History:         Patient has no prior history of Echocardiogram examinations.                 Previous Myocardial Infarction, Arrythmias:Atrial Fibrillation;                 Risk Factors:Hypertension.  Sonographer:    Clayton Lefort RDCS (AE) Referring Phys: [id number] [facility]  Sonographer Comments: Drainage tube and bandages in subcostal area. IMPRESSIONS  1. EF severely reduced, 25%. All mid and apical segments, and apex are akinetic. There is apical ballooning with basal hyperkinesis. Findings could represent a stress-induced cardiomyopathy vs large wrap around LAD infarction. Clinical correlation is recommended.  2. Left ventricular ejection fraction, by estimation, is 25 to 30%. The left ventricle has severely decreased function. The left ventricle demonstrates regional wall motion abnormalities (see scoring diagram/findings for description). There is mild concentric  left ventricular hypertrophy. Left ventricular diastolic parameters are consistent with Grade I diastolic dysfunction (impaired relaxation).  3. Right ventricular systolic function is normal. The right ventricular size is normal. Tricuspid regurgitation signal is inadequate for assessing PA pressure.  4. The mitral valve is degenerative. Trivial mitral valve regurgitation. No evidence of mitral stenosis.  5. The aortic valve is tricuspid. Aortic valve regurgitation is not visualized. Mild aortic valve sclerosis is present, with no evidence of aortic valve stenosis. FINDINGS  Left Ventricle: Left ventricular ejection fraction, by estimation, is 25 to 30%. The left ventricle has severely decreased function. The left ventricle demonstrates regional wall motion abnormalities. The left ventricular internal cavity size was normal  in size. There is mild concentric left ventricular hypertrophy. Left ventricular diastolic parameters are consistent with Grade I diastolic dysfunction (impaired relaxation). Normal left ventricular filling pressure.  LV Wall Scoring: The  mid and distal lateral wall, mid and distal anterior septum, and entire apex are akinetic. Right Ventricle: The right ventricular size is normal. No increase in right ventricular wall thickness. Right ventricular systolic function is normal. Tricuspid regurgitation signal is inadequate for assessing PA pressure. Left Atrium: Left atrial size was normal in size. Right Atrium: Right atrial size was normal in size. Pericardium: Trivial pericardial effusion is present. Presence of pericardial fat pad. Mitral Valve: The mitral valve is degenerative in appearance. Trivial mitral valve regurgitation. No evidence of mitral valve stenosis. Tricuspid Valve: The tricuspid valve is grossly normal. Tricuspid valve regurgitation is not demonstrated. No evidence of tricuspid stenosis. Aortic Valve: The aortic valve is tricuspid. Aortic valve regurgitation is not visualized. Mild aortic valve sclerosis is present, with no evidence of aortic valve stenosis. Aortic valve mean gradient measures 3.0 mmHg. Aortic valve peak gradient measures 6.8 mmHg. Aortic valve area, by VTI measures 1.88 cm. Pulmonic Valve: The pulmonic valve was grossly normal. Pulmonic valve regurgitation is not visualized. No evidence of pulmonic stenosis. Aorta: The aortic root and ascending aorta are structurally normal, with no evidence of dilitation. Venous: IVC assessment for right atrial pressure unable to be performed due to mechanical ventilation. IAS/Shunts: The atrial septum is grossly normal.  LEFT VENTRICLE PLAX 2D LVIDd:         4.20 cm     Diastology LVIDs:         3.50 cm     LV e' medial:   6.09 cm/s LV PW:         1.30 cm     LV E/e' medial: 16.6 LV IVS:        1.30 cm LVOT diam:     1.90 cm LV SV:         38 LV SV Index:   20 LVOT Area:     2.84 cm  LV Volumes (MOD) LV vol d, MOD A2C: 64.3 ml LV vol d, MOD A4C: 81.8 ml LV vol s, MOD A2C: 34.2 ml LV vol s, MOD A4C: 56.6 ml LV SV MOD A2C:     30.1 ml LV SV MOD A4C:     81.8 ml LV SV MOD BP:       28.2 ml RIGHT VENTRICLE RV Basal diam:  2.60 cm RV S prime:     12.33 cm/s TAPSE (M-mode): 1.2 cm LEFT ATRIUM             Index       RIGHT ATRIUM          Index LA diam:  3.00 cm 1.59 cm/m  RA Area:     8.49 cm LA Vol (A2C):   54.1 ml 28.72 ml/m RA Volume:   15.10 ml 8.02 ml/m LA Vol (A4C):   48.3 ml 25.64 ml/m LA Biplane Vol: 52.8 ml 28.03 ml/m  AORTIC VALVE AV Area (Vmax):    1.47 cm AV Area (Vmean):   1.54 cm AV Area (VTI):     1.88 cm AV Vmax:           130.00 cm/s AV Vmean:          80.900 cm/s AV VTI:            0.204 m AV Peak Grad:      6.8 mmHg AV Mean Grad:      3.0 mmHg LVOT Vmax:         67.20 cm/s LVOT Vmean:        43.900 cm/s LVOT VTI:          0.135 m LVOT/AV VTI ratio: 0.66  AORTA Ao Root diam: 3.80 cm Ao Asc diam:  3.10 cm MV E velocity: 100.99 cm/s MV A velocity: 104.79 cm/s  SHUNTS MV E/A ratio:  0.96         Systemic VTI:  0.14 m                             Systemic Diam: 1.90 cm Eleonore Chiquito MD Electronically signed by Eleonore Chiquito MD Signature Date/Time: 12/23/2019/4:09:33 PM    Final     Impression / Plan:   Impression: 1.  Acute hypoxic respiratory failure secondary to cardiac arrest with a right pneumothorax 2.  Cardiac arrest 3.  Leukocytosis 4.  Severe anemia: Hemoglobin 7.5 on 5/20--> 4.9 on 5/26 with no frank bleed but Hemoccult positive, transfused 4 units PRBCs--> hemoglobin 10.2, still with no signs of overt GI bleeding here, did have a brown stool yesterday per nursing 5.  Generalized weakness and dehydration 6.  AKI 7.  Acute on chronic systolic and diastolic heart failure with bradycardia 8.  History of dysphagia: Patient is on tube feeds with plans to restart tomorrow  Plan: 1.  Cardiology has discussed anticoagulation but do not want to initiate given severe anemia recently 2.  Discussed case with critical care who are requesting at least an EGD for further evaluation.  They are going to leave the patient intubated in order for [place] to perform. 3.   Discussed case with Dr. Rush Landmark who is able to perform EGD and possibly flex sig today but this would not be until around 6 PM this evening. Ordered tap water enemas x2. 4.  Continue supportive measures 5. Tried to call son at number listed-no answer 6.  Please await any further recommendations from Dr. Rush Landmark later today  Thank you for your kind consultation, we will continue to follow.  Lavone Nian [facility]  Jan 08, 2020, 11:47 AM

## 2020-01-03 NOTE — Progress Notes (Signed)
NAME:  Tyler Petersen, MRN:  [id number], DOB:  [birth year], LOS: 1 ADMISSION DATE:  January 30, 2020, CONSULTATION DATE:  01/30/2020 REFERRING MD:  Joanette Gula, CHIEF COMPLAINT: Cardiac arrest  Brief History   86 year old nursing home resident with chronic medical illness as below, multiple recent admissions to [facility] for infections, pneumonia,   Admit to [facility] on 5/24 with generalized weakness, severe dehydration, significant anemia with hemoglobin 7.5 on admission.  Subsequently Hb dropped to 4.9 on 5/26 with no frank bleed, Hemoccult positive stool.  Transfused 4 units PRBC and received IV iron.  Course complicated by profound leukocytosis group B strep bacteremia, for which he is on empiric Zosyn, hypokalemia, right elbow swelling with venous duplex negative for DVT  He had a cardiac arrest on 5/28 with asystole, left femoral line placed intubated and transferred to ICU.  Subsequently developed right pneumothorax requiring chest tube placement.  Transferred to [facility] for further management. During transport he was noted to be severely bradycardic and given 1 amp of atropine by [facility]  Past Medical History   Hypertension, myocardial infarction, A. fib, valvular heart disease, nursing home resident, frequent admissions to the hospital, vocal cord dysfunction secondary to prior intubation, anemia, BPH, UTI   has a past medical history of Acute blood loss anemia, Anxiety, BPH (benign prostatic hyperplasia), GI bleed, HTN (hypertension), Hyponatremia, NSTEMI (non-ST elevated myocardial infarction) (HCC), Osteoarthritis, Osteoarthritis, Rhabdomyolysis, and UTI (urinary tract infection).  Significant Hospital Events   5/24-admit to [facility] with weakness, dehydration, significant anemia s/p 4 units PRBC.  Awaiting transfer to different facility for GI eval. 5/28-asystolic cardiac arrest-Asytole then SVT and V. tach, pneumothorax post CPR requiring chest tube made DNR by family 5/29-transfer to  [facility] hospital. 5/30-more awake.  Consults:  Cardiology, GI  Procedures:  ET tube 5/28 > Femoral CVL 5/28 > Right chest tube 5/28 >  Significant Diagnostic Tests:  CT abdomen 12/27/2019-extensive subsegmental atelectasis in the lower lobes.  No acute findings in the abdomen  Right upper extremity 12/27/2019-negative for DVT  Echocardiogram 12/11/2019-LVEF 25%, wall motion abnormalities, grade 1 diastolic dysfunction.  Micro Data:  MRSA screen 12/26/2019-negative Blood culture 11/26/2019-strep agalactiae group 3  Tracheal aspirate 12/11/2019-gram-negative rods  Antimicrobials:  Zosyn 5/29>>   Interim history/subjective:   No acute events overnight.  Objective   Blood pressure (!) 119/55, pulse 72, temperature 98.8 F (37.1 C), temperature source Core, resp. rate 13, weight 74.9 kg, SpO2 90 %.    Vent Mode: PRVC FiO2 (%):  [30 %] 30 % Set Rate:  [12 bmp-16 bmp] 12 bmp Vt Set:  [470 mL-540 mL] 470 mL PEEP:  [5 cmH20] 5 cmH20 Pressure Support:  [8 cmH20] 8 cmH20 Plateau Pressure:  [13 cmH20-16 cmH20] 14 cmH20   Intake/Output Summary (Last 24 hours) at 12/07/2019 0927 Last data filed at 12/26/2019 0800 Gross per 24 hour  Intake 618.84 ml  Output 1795 ml  Net -1176.16 ml   Filed Weights   Jan 30, 2020 1254  Weight: 74.9 kg    Examination: Blood pressure (!) 119/55, pulse 72, temperature 98.8 F (37.1 C), temperature source Core, resp. rate 13, weight 74.9 kg, SpO2 90 %. Gen:      Elderly, chronically ill-appearing HEENT:  EOMI, sclera anicteric Neck:     No masses; no thyromegaly, ET tube Lungs:    Clear to auscultation bilaterally; normal respiratory effort CV:         Regular rate and rhythm; no murmurs Abd:      + bowel sounds; soft, non-tender; no  palpable masses, no distension Ext:    No edema; adequate peripheral perfusion Skin:      Warm and dry; no rash Neuro: Awake, moves all extremities in a nonpurposeful way.  Labs significant for BUN/creatinine 17/0.62 WBC  17.2, hemoglobin 10.2  Resolved Hospital Problem list     Assessment & Plan:  Acute hypoxic respiratory failure secondary to cardiac arrest  Right pneumothorax.   Chest tube to suction Start SBT's Follow chest x-ray  Cardiac arrest, severe cardiomyopathy with wall motion abnormalities Takotsubo versus MI Paroxysmal atrial fibrillation Follow troponins Cardiology on board.  Recommending medical management at present Amiodarone if he continues to have persistent arrhythmia.  Leukocytosis, group B strep bacteremia 1 out of 4 at [place].   Gram-negative rods in sputum Follow cultures Continue broad antibiotic coverage with Zosyn=  Severe anemia of malnutrition, chronic disease, iron deficiency anemia.  Previously had a EGD, colonoscopy in 2020 with findings of erythematous gastric mucosa, hemorrhoids and diverticuli. Follow CBC GI consult  Bradycardia- maybe sedation related.  Hold Lasix Atropine as needed Has responded to dopamine.  Will wean off if he can  Dysphagia Severe protein calorie malnutrition Keep n.p.o.  Vocal cord injury Patient apparently had prolonged intubation during previous admission with vocal cord injury and dysphagia with difficulty talking.  GERD Continue PPI  Dementia, failure to thrive Monitor neuro status  Goals of care Discussed with son Legrand Como over telephone.  Confirmed DNR status Told him that Mr. Trivedi is critically ill with very poor prognosis for recovery.  Recommended a time-limited trial of 48 hours.  We will need to reassess on Monday and consider comfort, palliation if there is no recovery. Legrand Como will discuss with his sister and get back to [place] about this.  Best practice:  Diet: NPO Pain/Anxiety/Delirium protocol (if indicated): Fentanyl,wean off propofol VAP protocol (if indicated): Ordered DVT prophylaxis: Heparin subcu GI prophylaxis: PPI Glucose control: Monitor Mobility: Bed Code Status: DNR Family Communication:  Son updated. See above.  Disposition: ICU   Critical care time:   The patient is critically ill with multiple organ system failure and requires high complexity decision making for assessment and support, frequent evaluation and titration of therapies, advanced monitoring, review of radiographic studies and interpretation of complex data.   Critical Care Time devoted to patient care services, exclusive of separately billable procedures, described in this note is 35 minutes.   [name] MD  Pulmonary and Critical Care Please see [email or web address] for pager details.  12/10/2019, 9:27 AM

## 2020-01-03 NOTE — Progress Notes (Signed)
PCCM progress note  Met at bedside with son and daughter.  They state that Tyler Petersen has declining health for the past year with multiple hospitalizations and that he would not want to be supported like this with poor quality of life. They declined invasive procedures such as EGD or colonoscopy Plan is transition to one-way extubation with comfort measures, once grandchildren have a chance to visit.   The patient is critically ill with multiple organ system failure and requires high complexity decision making for assessment and support, frequent evaluation and titration of therapies, advanced monitoring, review of radiographic studies and interpretation of complex data.   Critical Care Time devoted to patient care services, exclusive of separately billable procedures, described in this note is 35 minutes.   [name] MD [place] [facility] and Critical Care Please see [email or web address] for pager details.  13-Jan-2020, 12:58 PM

## 2020-01-03 NOTE — Progress Notes (Signed)
Updated by nurse that family has requested comfort measures and extubation. Orders placed.   Chilton Greathouse MD [place] [facility] Critical [facility] Please see [email or web address] for pager details.  12/19/2019, 1:53 PM

## 2020-01-03 DEATH — deceased

## 2020-01-05 LAB — CULTURE, BLOOD (ROUTINE X 2)
Culture: NO GROWTH
Culture: NO GROWTH
Special Requests: ADEQUATE

## 2020-09-29 IMAGING — CR DG CHEST 2V
2 series · 2 of 2 positions shown · non-contrast
Comparison: 12/06/2019

CLINICAL DATA: Weakness, pneumonia, COVID negative

EXAM:
CHEST - 2 VIEW

[chest lat]
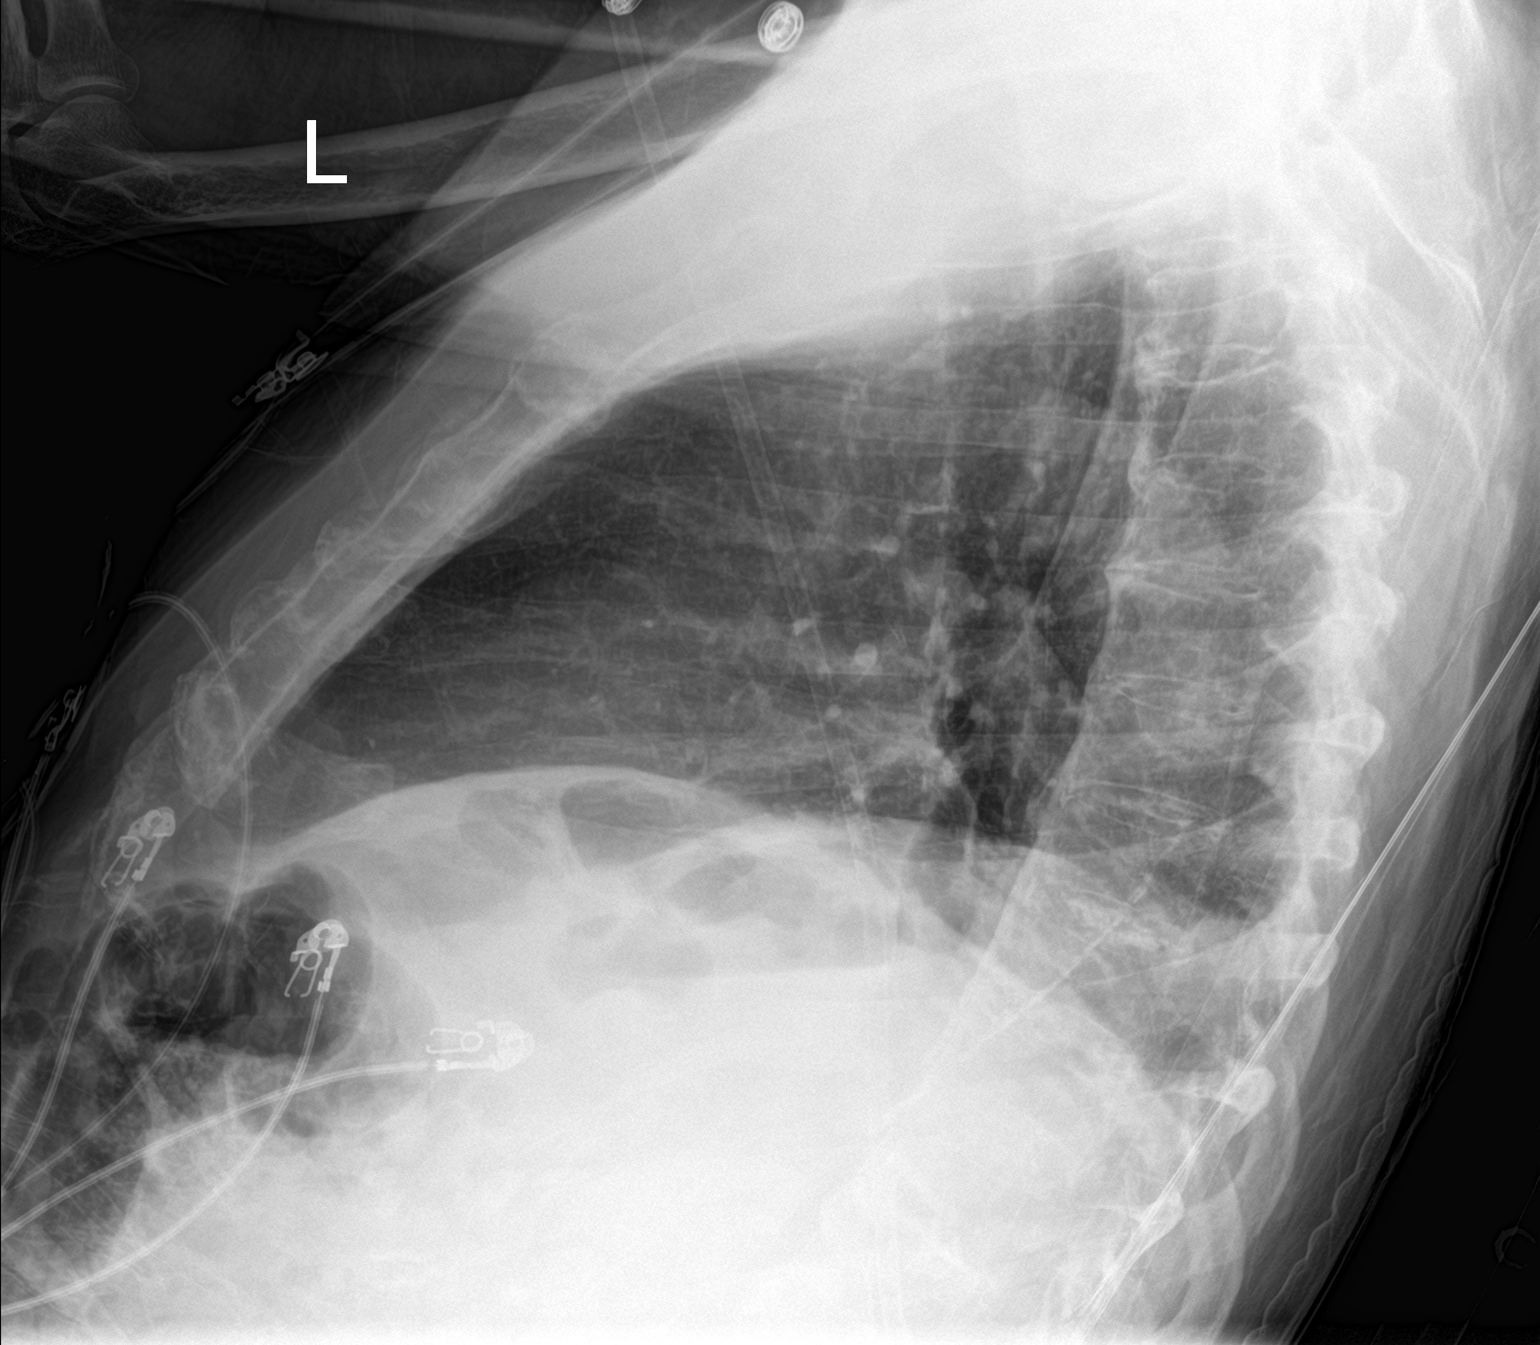

[chest ap]
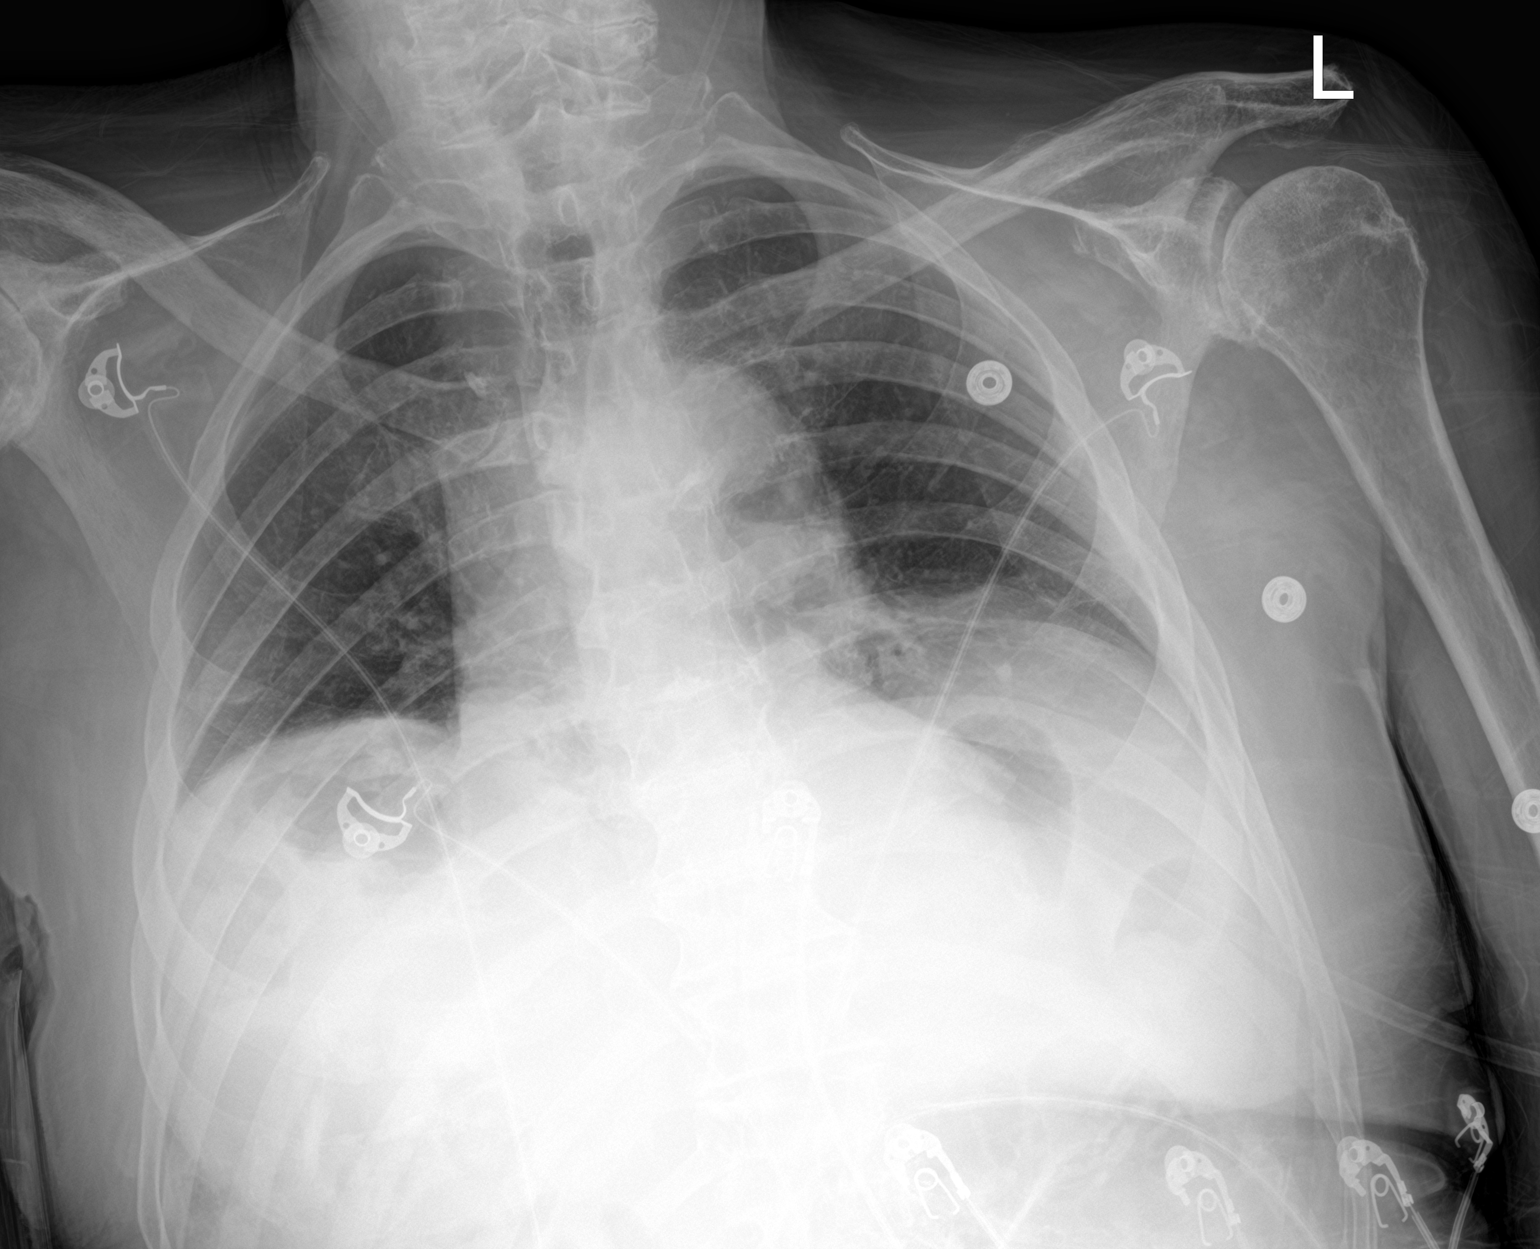

[2 of 2 positions shown; findings below may reference images not displayed]

FINDINGS: Very low lung volumes with bibasilar atelectasis. Stable heart size
and vascularity. No large effusion or pneumothorax. Degenerative
changes of the spine and atherosclerosis of aorta.
IMPRESSION: Similar low lung volumes and bibasilar atelectasis pattern.

Aortic atherosclerosis

## 2020-09-29 IMAGING — CT CT CHEST W/ CM
2 of 4 series · 15 of 36 positions shown, 18 images · IV contrast (omnipaque)
Comparison: Chest CT, 11/25/2019.  Current chest radiograph.

CLINICAL DATA: Shortness of breath and weakness. Coughing over the
last week. Diagnosed with pneumonia at his nursing facility.

EXAM:
CT CHEST WITH CONTRAST
TECHNIQUE: Multidetector CT imaging of the chest was performed during
intravenous contrast administration.
CONTRAST:  75mL OMNIPAQUE IOHEXOL 300 MG/ML  SOLN

[Series 3: thorax 2.0 i31f 2 · axial · 0.85mm/px · z∈[+971,+1273]mm · 12 of 173 slices shown, 15 images]
[im 11/173  mediastinal]
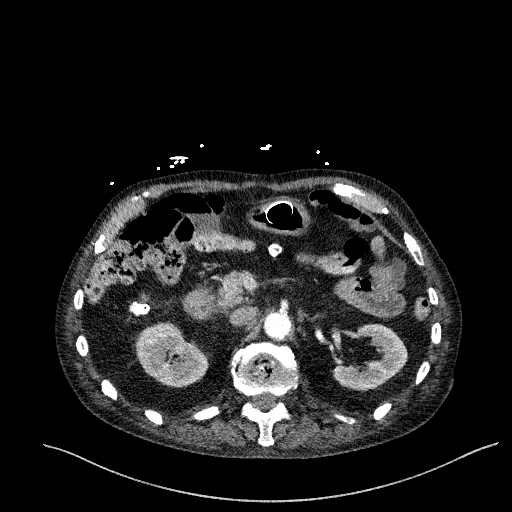
[im 11/173  lung]
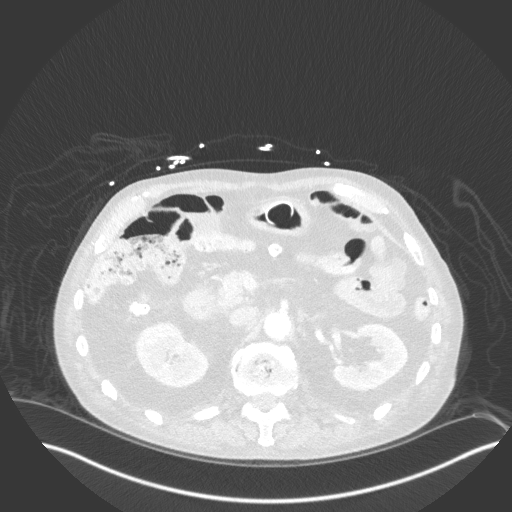
[im 31/173  lung]
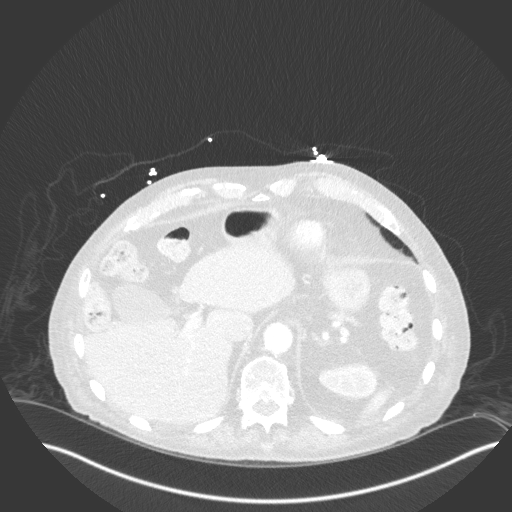
[im 41/173  lung]
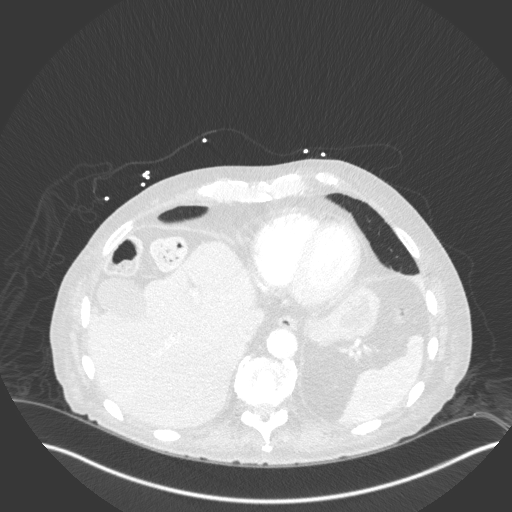
[im 51/173  lung]
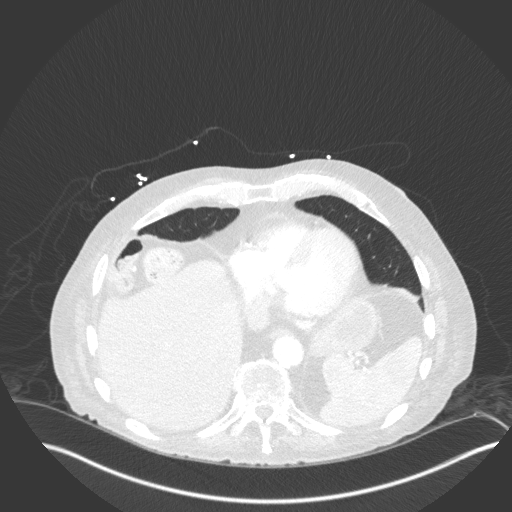
[im 71/173  mediastinal]
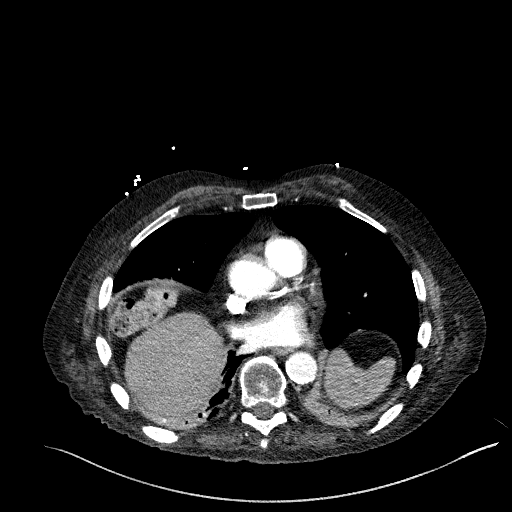
[im 71/173  lung]
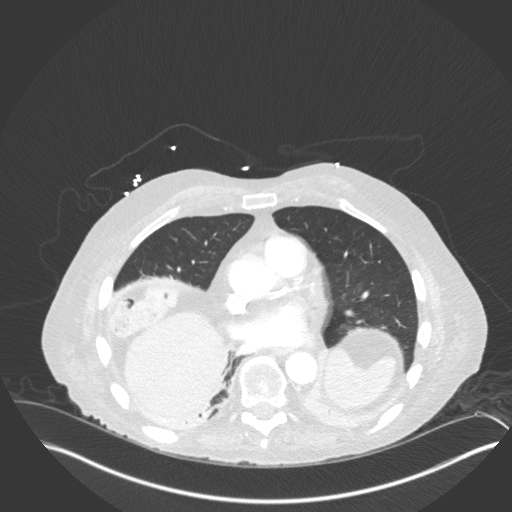
[im 81/173  lung]
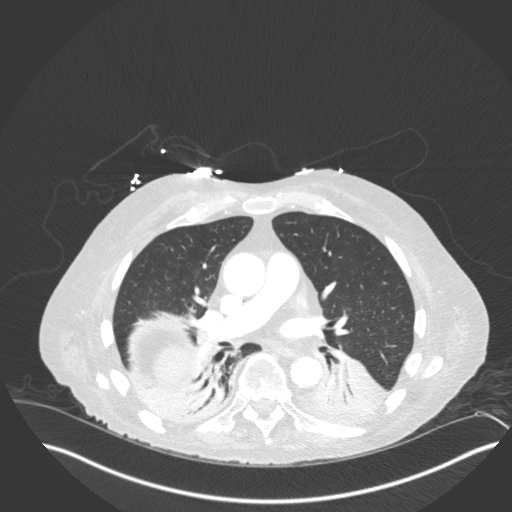
[im 92/173  lung]
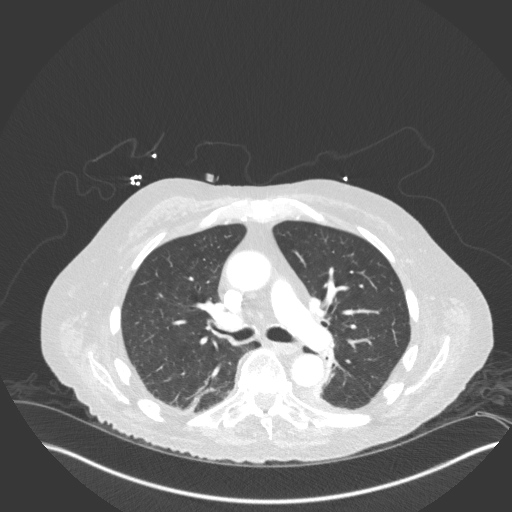
[im 112/173  lung]
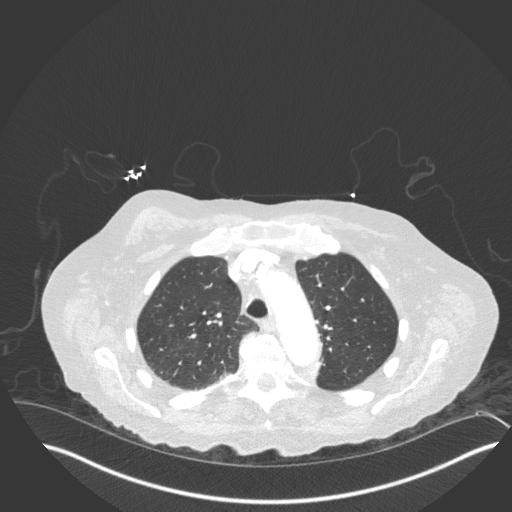
[im 122/173  mediastinal]
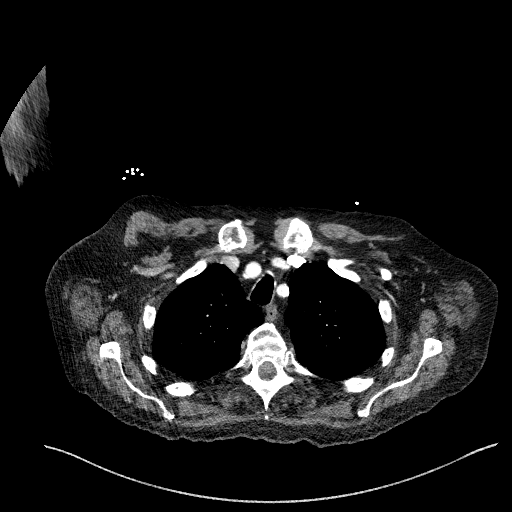
[im 122/173  lung]
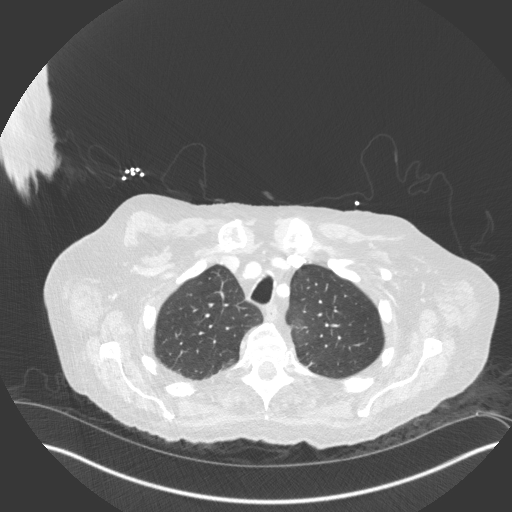
[im 132/173  lung]
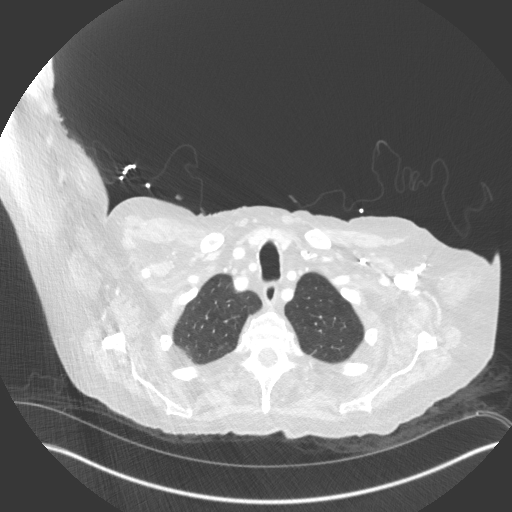
[im 152/173  lung]
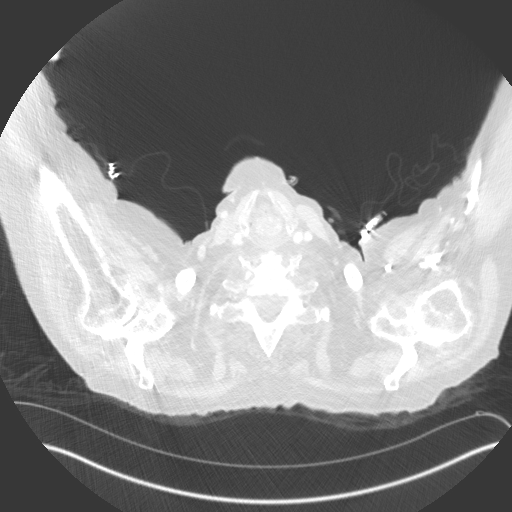
[im 162/173  lung]
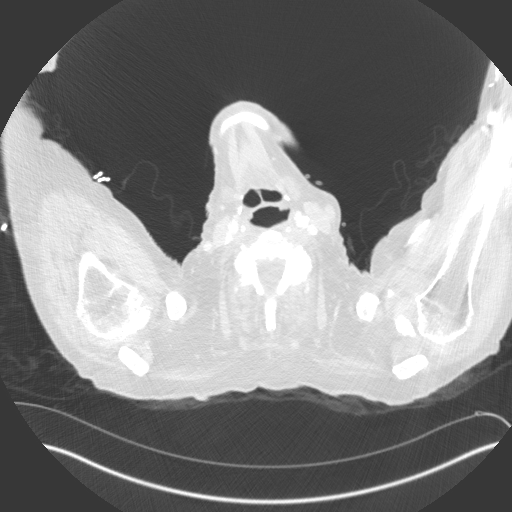

[Series 6: coronal · coronal · 0.67mm/px · 3 of 151 slices shown]
[im 31/151  lung]
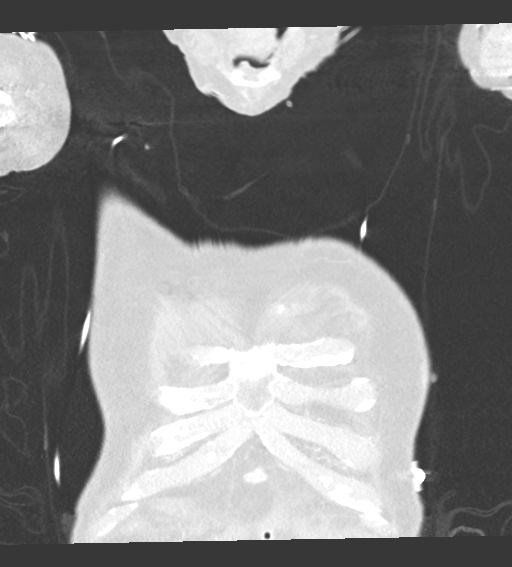
[im 61/151  lung]
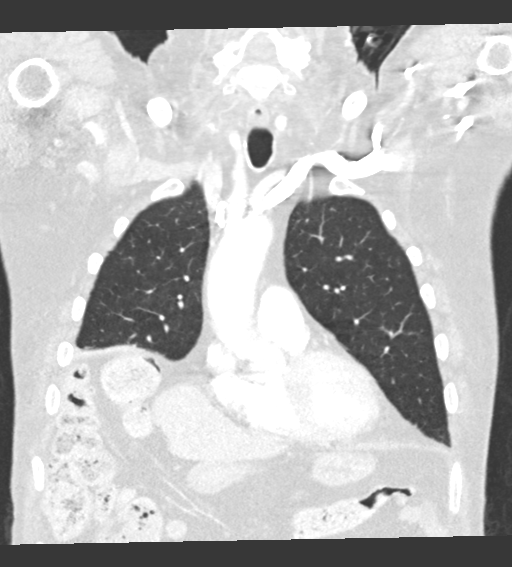
[im 91/151  lung]
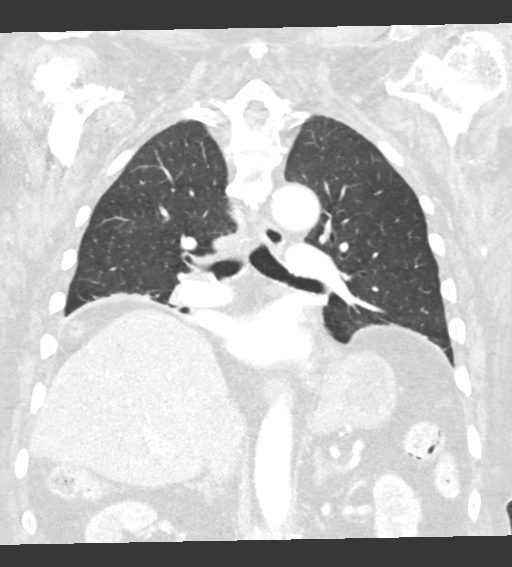

[15 of 36 positions shown; findings below may reference images not displayed]

FINDINGS: Cardiovascular: Heart is normal in size. No pericardial effusion.
Two vessel coronary artery calcifications. Great vessels are normal
in caliber. Mild aortic atherosclerosis. Prominent atherosclerosis
at the origin of the left subclavian artery with at least a 50%
narrowing at the origin.

Mediastinum/Nodes: No neck base, mediastinal or hilar masses or
enlarged lymph nodes. Trachea and esophagus are unremarkable.

Lungs/Pleura: Dependent opacity noted in the lower lobes associated
with mild bronchiectasis. Minor atelectasis at the diaphragmatic
base of the right middle lobe. Additional minor linear opacity in
the dependent right upper lobe, also consistent with atelectasis.
Remainder of the lungs is clear. Trace left pleural effusion. No
right pleural effusion. No pneumothorax.

Upper Abdomen: No acute findings.

Musculoskeletal: Chronic fractures of T12 and L1 unchanged from the
prior CT. Minor depression of the upper endplate of T3, also stable.
No acute fractures. No osteoblastic or osteolytic lesions.
IMPRESSION: 1. There has been significant interval improvement since the prior
chest CT. Right pleural effusion has resolved. There is minimal
residual left pleural fluid.
2. There is residual dependent opacities in the lower lobes with
associated mild bronchiectasis. Lung base opacities are likely all
atelectasis. A component of pneumonia is not excluded, but felt less
likely.
3. No new lung abnormalities.  No evidence of pulmonary edema.
4. Compression fractures of T12 and L1 stable from the prior chest
CT.

Aortic Atherosclerosis ([id number]).
